# Patient Record
Sex: Male | Born: 1985 | Race: Black or African American | Hispanic: No | Marital: Married | Smoking: Never smoker
Health system: Southern US, Community
[De-identification: ages and names within clinical notes are randomized; demographics above are authoritative.]

## PROBLEM LIST (undated history)

## (undated) DIAGNOSIS — E669 Obesity, unspecified: Secondary | ICD-10-CM

## (undated) DIAGNOSIS — J302 Other seasonal allergic rhinitis: Secondary | ICD-10-CM

## (undated) DIAGNOSIS — K219 Gastro-esophageal reflux disease without esophagitis: Secondary | ICD-10-CM

## (undated) DIAGNOSIS — Z8619 Personal history of other infectious and parasitic diseases: Secondary | ICD-10-CM

## (undated) DIAGNOSIS — I10 Essential (primary) hypertension: Secondary | ICD-10-CM

## (undated) DIAGNOSIS — B20 Human immunodeficiency virus [HIV] disease: Secondary | ICD-10-CM

## (undated) DIAGNOSIS — G43909 Migraine, unspecified, not intractable, without status migrainosus: Secondary | ICD-10-CM

## (undated) DIAGNOSIS — Z21 Asymptomatic human immunodeficiency virus [HIV] infection status: Secondary | ICD-10-CM

## (undated) HISTORY — DX: Migraine, unspecified, not intractable, without status migrainosus: G43.909

## (undated) HISTORY — DX: Personal history of other infectious and parasitic diseases: Z86.19

## (undated) HISTORY — DX: Obesity, unspecified: E66.9

## (undated) HISTORY — DX: Other seasonal allergic rhinitis: J30.2

## (undated) HISTORY — DX: Gastro-esophageal reflux disease without esophagitis: K21.9

---

## 2007-04-21 HISTORY — PX: WISDOM TOOTH EXTRACTION: SHX21

## 2010-12-30 ENCOUNTER — Other Ambulatory Visit: Payer: Self-pay | Admitting: Internal Medicine

## 2010-12-30 DIAGNOSIS — Z0000 Encounter for general adult medical examination without abnormal findings: Secondary | ICD-10-CM

## 2010-12-30 LAB — URINALYSIS
Leukocytes, UA: NEGATIVE
Nitrite: NEGATIVE
Specific Gravity, Urine: 1.03 (ref 1.005–1.030)
pH: 6 (ref 5.0–8.0)

## 2010-12-30 LAB — BASIC METABOLIC PANEL
BUN: 13 mg/dL (ref 6–23)
CO2: 28 mEq/L (ref 19–32)
Calcium: 9.6 mg/dL (ref 8.4–10.5)
Glucose, Bld: 84 mg/dL (ref 70–99)
Sodium: 140 mEq/L (ref 135–145)

## 2010-12-30 LAB — LIPID PANEL
Cholesterol: 148 mg/dL (ref 0–200)
HDL: 33 mg/dL — ABNORMAL LOW (ref >39)

## 2010-12-30 LAB — HEPATIC FUNCTION PANEL
AST: 19 U/L (ref 0–37)
Albumin: 4.4 g/dL (ref 3.5–5.2)
Alkaline Phosphatase: 74 U/L (ref 39–117)
Total Protein: 7 g/dL (ref 6.0–8.3)

## 2010-12-30 LAB — TSH: TSH: 1.181 u[IU]/mL (ref 0.350–4.500)

## 2010-12-31 LAB — CBC
HCT: 42.4 % (ref 39.0–52.0)
Hemoglobin: 14.4 g/dL (ref 13.0–17.0)
MCH: 25.2 pg — ABNORMAL LOW (ref 26.0–34.0)
RBC: 5.72 MIL/uL (ref 4.22–5.81)

## 2011-01-01 ENCOUNTER — Ambulatory Visit (INDEPENDENT_AMBULATORY_CARE_PROVIDER_SITE_OTHER): Payer: 59 | Admitting: Internal Medicine

## 2011-01-01 ENCOUNTER — Encounter: Payer: Self-pay | Admitting: Internal Medicine

## 2011-01-01 DIAGNOSIS — N62 Hypertrophy of breast: Secondary | ICD-10-CM

## 2011-01-01 DIAGNOSIS — Z0000 Encounter for general adult medical examination without abnormal findings: Secondary | ICD-10-CM

## 2011-01-01 DIAGNOSIS — E786 Lipoprotein deficiency: Secondary | ICD-10-CM

## 2011-01-02 ENCOUNTER — Encounter: Payer: Self-pay | Admitting: Internal Medicine

## 2011-01-02 ENCOUNTER — Other Ambulatory Visit: Payer: Self-pay | Admitting: Internal Medicine

## 2011-01-02 DIAGNOSIS — E785 Hyperlipidemia, unspecified: Secondary | ICD-10-CM

## 2011-01-02 DIAGNOSIS — N62 Hypertrophy of breast: Secondary | ICD-10-CM | POA: Insufficient documentation

## 2011-01-02 DIAGNOSIS — E786 Lipoprotein deficiency: Secondary | ICD-10-CM | POA: Insufficient documentation

## 2011-01-02 DIAGNOSIS — Z0000 Encounter for general adult medical examination without abnormal findings: Secondary | ICD-10-CM | POA: Insufficient documentation

## 2011-01-02 NOTE — Assessment & Plan Note (Signed)
Recommend regular cardio exercise at least 4 days a week for 30 mins + duration. Repeat lipid prior to next visit

## 2011-01-02 NOTE — Assessment & Plan Note (Addendum)
Obtain prolactin, estradiol, and lh with next lab draw.

## 2011-01-02 NOTE — Progress Notes (Signed)
  Subjective:    Patient ID: Thomas Rivera, male    DOB: 09/21/1985, 25 y.o.   MRN: [id number]  HPI Pt presents to clinic to establish care and for physical exam. Denies significant past hx and feels well. Does note chronic bilateral breast enlargement which is stable. Attempting exercise. No other complaints.  Reviewed pmh, psh, medications, allergies, soc hx and fam hx.   Review of Systems  Respiratory: Negative for shortness of breath.   Cardiovascular: Negative for chest pain.  Gastrointestinal: Negative for abdominal pain and blood in stool.  All other systems reviewed and are negative.       Objective:   Physical Exam  Physical Exam  Nursing note and vitals reviewed. Constitutional: He appears well-developed and well-nourished. No distress.  HENT:  Head: Normocephalic and atraumatic.  Right Ear: Tympanic membrane and external ear normal.  Left Ear: Tympanic membrane and external ear normal.  Nose: Nose normal.  Mouth/Throat: Uvula is midline, oropharynx is clear and moist and mucous membranes are normal. No oropharyngeal exudate.  Eyes: Conjunctivae and EOM are normal. Pupils are equal, round, and reactive to light. Right eye exhibits no discharge. Left eye exhibits no discharge. No scleral icterus.  Neck: Neck supple. Carotid bruit is not present. No thyromegaly present.  Cardiovascular: Normal rate, regular rhythm and normal heart sounds.  Exam reveals no gallop and no friction rub.   No murmur heard. Pulmonary/Chest: Effort normal and breath sounds normal. No respiratory distress. He has no wheezes. He has no rales.  Abdominal: Soft. He exhibits no distension and no mass. There is no hepatosplenomegaly. There is no tenderness. There is no rebound. Hernia confirmed negative in the right inguinal area and confirmed negative in the left inguinal area.  Genitourinary: Rectum normal, prostate normal and testes normal. Rectal exam shows no mass and no tenderness. Guaiac  negative stool. Prostate is not enlarged and not tender. Right testis shows no mass, no swelling and no tenderness. Right testis is descended. Left testis shows no mass, no swelling and no tenderness. Left testis is descended.  Lymphadenopathy:    He has no cervical adenopathy.       Right: No inguinal adenopathy present.       Left: No inguinal adenopathy present.  Neurological: He is alert.  Skin: Skin is warm and dry. He is not diaphoretic.  Psychiatric: He has a normal mood and affect.   Breast: bilateral gynecomastia      Assessment & Plan:

## 2011-01-02 NOTE — Assessment & Plan Note (Addendum)
Reviewed cpe labs nl except low hdl.

## 2011-05-07 ENCOUNTER — Ambulatory Visit: Payer: 59 | Admitting: Internal Medicine

## 2011-05-20 ENCOUNTER — Ambulatory Visit (INDEPENDENT_AMBULATORY_CARE_PROVIDER_SITE_OTHER): Payer: 59 | Admitting: Family

## 2011-05-20 ENCOUNTER — Encounter: Payer: Self-pay | Admitting: Family

## 2011-05-20 DIAGNOSIS — A088 Other specified intestinal infections: Secondary | ICD-10-CM

## 2011-05-20 DIAGNOSIS — A084 Viral intestinal infection, unspecified: Secondary | ICD-10-CM

## 2011-05-20 MED ORDER — ONDANSETRON HCL 4 MG PO TABS: 4.0000 mg | ORAL_TABLET | Freq: Three times a day (TID) | ORAL | Status: AC | PRN

## 2011-05-20 NOTE — Patient Instructions (Signed)
You may use OTC imodium as needed for diarrhea. Viral Gastroenteritis Viral gastroenteritis is also known as stomach flu. This condition affects the stomach and intestinal tract. The illness typically lasts 3 to 8 days. Most people develop an immune response. This eventually gets rid of the virus. While this natural response develops, the virus can make you quite ill.  CAUSES  Diarrhea and vomiting are often caused by a virus. Medicines (antibiotics) that kill germs will not help unless there is also a germ (bacterial) infection. SYMPTOMS  The most common symptom is diarrhea. This can cause severe loss of fluids (dehydration) and body salt (electrolyte) imbalance. TREATMENT  Treatments for this illness are aimed at rehydration. Antidiarrheal medicines are not recommended. They do not decrease diarrhea volume and may be harmful. Usually, home treatment is all that is needed. The most serious cases involve vomiting so severely that you are not able to keep down fluids taken by mouth (orally). In these cases, intravenous (IV) fluids are needed. Vomiting with viral gastroenteritis is common, but it will usually go away with treatment. HOME CARE INSTRUCTIONS   Small amounts of fluids should be taken frequently. Large amounts at one time may not be tolerated. Plain water may be harmful in infants and the elderly. Oral rehydration solutions (ORS) are available at pharmacies and grocery stores. ORS replace water and important electrolytes in proper proportions. Sports drinks are not as effective as ORS and may be harmful due to sugars worsening diarrhea.  Adults should eat normally while drinking more fluids than usual. Drink small amounts of fluids frequently and increase as tolerated. Drink enough water and fluids to keep your urine clear or pale yellow. Broths, weak decaffeinated tea, lemon-lime soft drinks (allowed to go flat), and ORS replace fluids and electrolytes.   Avoid:   Carbonated drinks.    Juice.   Extremely hot or cold fluids.   Caffeine drinks.   Fatty, greasy foods.   Alcohol.   Tobacco.   Too much intake of anything at one time.   Gelatin desserts.   Probiotics are active cultures of beneficial bacteria. They may lessen the amount and number of diarrheal stools in adults. Probiotics can be found in yogurt with active cultures and in supplements.   Wash your hands well to avoid spreading bacteria and viruses.   Antidiarrheal medicines are not recommended for infants and children.   Only take over-the-counter or prescription medicines for pain, discomfort, or fever as directed by your caregiver. Do not give aspirin to children.   For adults with dehydration, ask your caregiver if you should continue all prescribed and over-the-counter medicines.   If your caregiver has given you a follow-up appointment, it is very important to keep that appointment. Not keeping the appointment could result in a lasting (chronic) or permanent injury and disability. If there is any problem keeping the appointment, you must call to reschedule.  SEEK IMMEDIATE MEDICAL CARE IF:   You are unable to keep fluids down.   There is no urine output in 6 to 8 hours or there is only a small amount of very dark urine.   You develop shortness of breath.   There is blood in the vomit (may look like coffee grounds) or stool.   Belly (abdominal) pain develops, increases, or localizes.   There is persistent vomiting or diarrhea.   You have a fever.   Your baby is older than 3 months with a rectal temperature of 102 F (38.9 C) or higher.  Your baby is 47 months old or younger with a rectal temperature of 100.4 F (38 C) or higher.  MAKE SURE YOU:   Understand these instructions.   Will watch your condition.   Will get help right away if you are not doing well or get worse.  Document Released: 04/06/2005 Document Revised: 12/17/2010 Document Reviewed: 08/18/2006 [facility]  Patient Information 2012 [place], [place].

## 2011-05-20 NOTE — Assessment & Plan Note (Signed)
26 yr old male with symptoms consistent with acute viral gastroenteritis.  Recommended hydration, supportive measures such as prn imodium or zofran. He is to contact us if symptoms worsen or if he is not feeling better in 2-3 days.

## 2011-05-20 NOTE — Progress Notes (Signed)
Subjective:    Patient ID: Thomas Rivera, male    DOB: 11-22-1985, 26 y.o.   MRN: [id number]  HPI  Mr.  Thomas Rivera is a 26 yr old male who presents today with chief complaint of diarrhea.  He reports that symptoms started yesterday. He reports 10 loose stools yesterday and 4 loose stools today which he describes as pudding consistency. He has had associated nausea- vomited once yesterday.  Denies associated black, bloody stools or  fever.  He has not taken anything over the counter.  He has slept most of the day so far today.  He has not had any sick contacts.     Review of Systems See HPI  Past Medical History  Diagnosis Date  . History of chicken pox     childhood  . GERD (gastroesophageal reflux disease)   . Seasonal allergies   . Migraines      1-2 per month    History   Social History  . Marital Status: Married    Spouse Name: N/A    Number of Children: N/A  . Years of Education: N/A   Occupational History  . Not on file.   Social History Main Topics  . Smoking status: Never Smoker   . Smokeless tobacco: Never Used  . Alcohol Use: Yes     socially  . Drug Use: No  . Sexually Active: Not on file   Other Topics Concern  . Not on file   Social History Narrative  . No narrative on file    Past Surgical History  Procedure Date  . Wisdom tooth extraction 2009    Family History  Problem Relation Age of Onset  . Arthritis      maternal great grandmother  . Hypertension Maternal Grandmother   . Diabetes      maternal great grandmother  . Healthy Brother     half     Allergies  Allergen Reactions  . Amoxicillin     Severe swelling  . Bee     Severe swelling of sting site    Current Outpatient Prescriptions on File Prior to Visit  Medication Sig Dispense Refill  . eletriptan (RELPAX) 40 MG tablet One tablet by mouth as needed for migraine headache.  If the headache improves and then returns, dose may be repeated after 2 hours have elapsed since first  dose (do not exceed 80 mg per day). may repeat in 2 hours if necessary       . ibuprofen (ADVIL,MOTRIN) 800 MG tablet Take 800 mg by mouth every 8 (eight) hours as needed.          BP 126/82  Pulse 74  Temp(Src) 98.1 F (36.7 C) (Oral)  Resp 18  Ht 5' 8.5" (1.74 m)  Wt 283 lb (128.368 kg)  BMI 42.40 kg/m2  SpO2 98%       Objective:   Physical Exam  Constitutional: He appears well-developed and well-nourished. No distress.  Eyes: No scleral icterus.  Cardiovascular: Normal rate and regular rhythm.   No murmur heard. Pulmonary/Chest: Effort normal and breath sounds normal. No respiratory distress. He has no wheezes. He has no rales. He exhibits no tenderness.  Abdominal: Soft. Bowel sounds are normal. He exhibits no distension and no mass. There is no tenderness. There is no rebound and no guarding.  Skin: Skin is warm and dry.  Psychiatric: He has a normal mood and affect. His behavior is normal. Judgment and thought content normal.  Assessment & Plan:

## 2011-06-03 ENCOUNTER — Telehealth: Payer: Self-pay | Admitting: Internal Medicine

## 2011-06-03 MED ORDER — SCOPOLAMINE 1 MG/3DAYS TD PT72: MEDICATED_PATCH | TRANSDERMAL | Status: DC

## 2011-06-03 NOTE — Telephone Encounter (Signed)
Patient states that he will be flying and he is requesting that Dr. Rodena Medin send nausea patches to [facility] on [address].

## 2011-06-03 NOTE — Telephone Encounter (Signed)
Believe is talking about scopolamine patch. Apply behind ear 4 hours before event. May change after 3 days. One box

## 2011-06-03 NOTE — Telephone Encounter (Signed)
Call placed to patient at [phone], he was informed Rx sent to pharmacy per Dr Rodena Medin okay. Rx resubmitted to correct pharmacy.

## 2011-07-20 ENCOUNTER — Encounter: Payer: Self-pay | Admitting: Internal Medicine

## 2011-07-20 ENCOUNTER — Ambulatory Visit (INDEPENDENT_AMBULATORY_CARE_PROVIDER_SITE_OTHER): Payer: 59 | Admitting: Internal Medicine

## 2011-07-20 VITALS — BP 118/84 | HR 84 | Temp 98.0°F | Resp 18

## 2011-07-20 DIAGNOSIS — J069 Acute upper respiratory infection, unspecified: Secondary | ICD-10-CM

## 2011-07-20 MED ORDER — DOXYCYCLINE HYCLATE 100 MG PO TABS: 100.0000 mg | ORAL_TABLET | Freq: Two times a day (BID) | ORAL | Status: AC

## 2011-07-29 DIAGNOSIS — J069 Acute upper respiratory infection, unspecified: Secondary | ICD-10-CM | POA: Insufficient documentation

## 2011-07-29 NOTE — Assessment & Plan Note (Signed)
Continue otc sx tx prn. Given abx to hold. Begin abx if sx's do not improve after total duration of 8-10 days. Followup if no improvement or worsening.

## 2011-07-29 NOTE — Progress Notes (Signed)
  Subjective:    Patient ID: Thomas Rivera, male    DOB: 1986-03-20, 26 y.o.   MRN: [id number]  HPI Pt presents to clinic for evaluation of cough. Notes 2d h/o sinus pressure, nasal congestion/drainage and cough productive for green sputum. No f/c. Taking otc medication without improvement. No alleviating or exacerbating factors. No other complaints.  Past Medical History  Diagnosis Date  . History of chicken pox     childhood  . GERD (gastroesophageal reflux disease)   . Seasonal allergies   . Migraines      1-2 per month   Past Surgical History  Procedure Date  . Wisdom tooth extraction 2009    reports that he has never smoked. He has never used smokeless tobacco. He reports that he drinks alcohol. He reports that he does not use illicit drugs. family history includes Arthritis in an unspecified family member; Diabetes in an unspecified family member; Healthy in his brother; and Hypertension in his maternal grandmother. Allergies  Allergen Reactions  . Amoxicillin     Severe swelling  . Bee     Severe swelling of sting site     Review of Systems see hpi     Objective:   Physical Exam  Nursing note and vitals reviewed. Constitutional: He appears well-developed and well-nourished. No distress.  HENT:  Head: Normocephalic and atraumatic.  Right Ear: External ear normal.  Left Ear: External ear normal.  Nose: Nose normal.  Mouth/Throat: Oropharynx is clear and moist. No oropharyngeal exudate.  Eyes: Conjunctivae are normal. Right eye exhibits no discharge. Left eye exhibits no discharge. No scleral icterus.  Neck: Neck supple.  Pulmonary/Chest: Effort normal and breath sounds normal. No respiratory distress. He has no wheezes. He has no rales.  Lymphadenopathy:    He has no cervical adenopathy.  Neurological: He is alert.  Skin: Skin is warm and dry. He is not diaphoretic.  Psychiatric: He has a normal mood and affect.          Assessment & Plan:

## 2011-08-04 ENCOUNTER — Encounter: Payer: Self-pay | Admitting: Internal Medicine

## 2011-08-04 ENCOUNTER — Ambulatory Visit (INDEPENDENT_AMBULATORY_CARE_PROVIDER_SITE_OTHER): Payer: 59 | Admitting: Internal Medicine

## 2011-08-04 VITALS — BP 110/80 | HR 95 | Temp 98.4°F | Resp 16 | Wt 302.1 lb

## 2011-08-04 DIAGNOSIS — R5381 Other malaise: Secondary | ICD-10-CM

## 2011-08-04 DIAGNOSIS — R635 Abnormal weight gain: Secondary | ICD-10-CM

## 2011-08-04 DIAGNOSIS — R0600 Dyspnea, unspecified: Secondary | ICD-10-CM

## 2011-08-04 DIAGNOSIS — R0989 Other specified symptoms and signs involving the circulatory and respiratory systems: Secondary | ICD-10-CM

## 2011-08-04 DIAGNOSIS — R609 Edema, unspecified: Secondary | ICD-10-CM

## 2011-08-04 DIAGNOSIS — R110 Nausea: Secondary | ICD-10-CM

## 2011-08-04 DIAGNOSIS — R5383 Other fatigue: Secondary | ICD-10-CM

## 2011-08-04 MED ORDER — ONDANSETRON HCL 8 MG PO TABS: 8.0000 mg | ORAL_TABLET | Freq: Three times a day (TID) | ORAL | Status: AC | PRN

## 2011-08-04 MED ORDER — FUROSEMIDE 20 MG PO TABS: ORAL_TABLET | ORAL | Status: DC

## 2011-08-04 NOTE — Progress Notes (Signed)
  Subjective:    Patient ID: Thomas Rivera, male    DOB: 1986-03-28, 26 y.o.   MRN: [id number]  HPI Pt presents to clinic for evaluation of swelling. Notes one week h/o bilateral le swelling as well as ~6d h/o facial swelling. Notes slight dyspnea as generalized fatigue and malaise. Has unprovoked wt gain possibly ~10lbs over the past week. Notes mild nausea without emesis or abdominal pain. Recent URI resolved. No other alleviating or exacerbating factors.  Past Medical History  Diagnosis Date  . History of chicken pox     childhood  . GERD (gastroesophageal reflux disease)   . Seasonal allergies   . Migraines      1-2 per month   Past Surgical History  Procedure Date  . Wisdom tooth extraction 2009    reports that he has never smoked. He has never used smokeless tobacco. He reports that he drinks alcohol. He reports that he does not use illicit drugs. family history includes Arthritis in an unspecified family member; Diabetes in an unspecified family member; Healthy in his brother; and Hypertension in his maternal grandmother. Allergies  Allergen Reactions  . Amoxicillin     Severe swelling  . Bee     Severe swelling of sting site      Review of Systems see hpi     Objective:   Physical Exam  Nursing note and vitals reviewed. Constitutional: He appears well-developed and well-nourished. No distress.  HENT:  Head: Normocephalic and atraumatic.  Right Ear: External ear normal.  Left Ear: External ear normal.  Eyes: Conjunctivae are normal. No scleral icterus.  Neck: Neck supple. No JVD present.  Cardiovascular: Normal rate, regular rhythm and normal heart sounds.  Exam reveals no gallop and no friction rub.   No murmur heard. Pulmonary/Chest: Effort normal and breath sounds normal. No respiratory distress. He has no wheezes. He has no rales.  Neurological: He is alert.  Skin: Skin is warm and dry. He is not diaphoretic.       Bilateral le ext: trace to +1 edema    Psychiatric: He has a normal mood and affect.          Assessment & Plan:

## 2011-08-05 ENCOUNTER — Telehealth: Payer: Self-pay | Admitting: Internal Medicine

## 2011-08-05 ENCOUNTER — Ambulatory Visit (INDEPENDENT_AMBULATORY_CARE_PROVIDER_SITE_OTHER)
Admission: RE | Admit: 2011-08-05 | Discharge: 2011-08-05 | Disposition: A | Discharge status: Home / Self Care | Payer: 59 | Source: Ambulatory Visit | Attending: Internal Medicine | Admitting: Internal Medicine

## 2011-08-05 ENCOUNTER — Ambulatory Visit: Payer: 59 | Admitting: Internal Medicine

## 2011-08-05 ENCOUNTER — Other Ambulatory Visit (INDEPENDENT_AMBULATORY_CARE_PROVIDER_SITE_OTHER): Payer: 59 | Admitting: Internal Medicine

## 2011-08-05 ENCOUNTER — Ambulatory Visit (HOSPITAL_BASED_OUTPATIENT_CLINIC_OR_DEPARTMENT_OTHER)
Admission: RE | Admit: 2011-08-05 | Discharge: 2011-08-05 | Disposition: A | Discharge status: Home / Self Care | Payer: 59 | Source: Ambulatory Visit | Attending: Internal Medicine | Admitting: Internal Medicine

## 2011-08-05 DIAGNOSIS — R609 Edema, unspecified: Secondary | ICD-10-CM | POA: Insufficient documentation

## 2011-08-05 DIAGNOSIS — R7989 Other specified abnormal findings of blood chemistry: Secondary | ICD-10-CM

## 2011-08-05 DIAGNOSIS — R1900 Intra-abdominal and pelvic swelling, mass and lump, unspecified site: Secondary | ICD-10-CM

## 2011-08-05 DIAGNOSIS — R945 Abnormal results of liver function studies: Secondary | ICD-10-CM | POA: Insufficient documentation

## 2011-08-05 DIAGNOSIS — R109 Unspecified abdominal pain: Secondary | ICD-10-CM

## 2011-08-05 DIAGNOSIS — J029 Acute pharyngitis, unspecified: Secondary | ICD-10-CM

## 2011-08-05 DIAGNOSIS — R748 Abnormal levels of other serum enzymes: Secondary | ICD-10-CM

## 2011-08-05 DIAGNOSIS — N289 Disorder of kidney and ureter, unspecified: Secondary | ICD-10-CM

## 2011-08-05 LAB — CBC WITH DIFFERENTIAL/PLATELET
Basophils Absolute: 0 10*3/uL (ref 0.0–0.1)
Basophils Relative: 0 % (ref 0–1)
Eosinophils Relative: 2 % (ref 0–5)
HCT: 44.6 % (ref 39.0–52.0)
MCHC: 33.4 g/dL (ref 30.0–36.0)
MCV: 74 fL — ABNORMAL LOW (ref 78.0–100.0)
Monocytes Absolute: 0.7 10*3/uL (ref 0.1–1.0)
RDW: 13.2 % (ref 11.5–15.5)

## 2011-08-05 LAB — HEPATIC FUNCTION PANEL
ALT: 82 U/L — ABNORMAL HIGH (ref 0–53)
ALT: 96 U/L — ABNORMAL HIGH (ref 0–53)
Albumin: <2 g/dL — ABNORMAL LOW (ref 3.5–5.2)
Bilirubin, Direct: 0.1 mg/dL (ref 0.0–0.3)
Indirect Bilirubin: 0.4 mg/dL (ref 0.0–0.9)
Total Bilirubin: 0.4 mg/dL (ref 0.3–1.2)
Total Protein: 4.7 g/dL — ABNORMAL LOW (ref 6.0–8.3)

## 2011-08-05 LAB — BASIC METABOLIC PANEL
BUN: 31 mg/dL — ABNORMAL HIGH (ref 6–23)
Creat: 1.47 mg/dL — ABNORMAL HIGH (ref 0.50–1.35)

## 2011-08-05 LAB — URINALYSIS, MICROSCOPIC ONLY: Bacteria, UA: NONE SEEN

## 2011-08-05 LAB — FERRITIN: Ferritin: 324 ng/mL — ABNORMAL HIGH (ref 22–322)

## 2011-08-05 LAB — POCT RAPID STREP A (OFFICE): Rapid Strep A Screen: NEGATIVE

## 2011-08-05 LAB — URINALYSIS, ROUTINE W REFLEX MICROSCOPIC
Leukocytes, UA: NEGATIVE
Nitrite: NEGATIVE
Protein, ur: >300 mg/dL — AB
Urobilinogen, UA: 0.2 mg/dL (ref 0.0–1.0)

## 2011-08-05 LAB — PROTIME-INR
INR: 0.98 (ref <1.50)
Prothrombin Time: 13.4 seconds (ref 11.6–15.2)

## 2011-08-05 LAB — T4, FREE: Free T4: 0.9 ng/dL (ref 0.80–1.80)

## 2011-08-05 LAB — ANTISTREPTOLYSIN O TITER: ASO: <25 IU/mL (ref 0–408)

## 2011-08-05 LAB — TSH: TSH: 3.25 u[IU]/mL (ref 0.350–4.500)

## 2011-08-05 NOTE — Telephone Encounter (Signed)
Call placed to patient at [phone], he was informed ultrasound could be performed at 2p. He was advised to return to office for stat blood work and then to radiology for ultrasound. Patient has verbalized understanding and will proceed to lab for requested blood work and radiology for ultrasound.

## 2011-08-05 NOTE — Telephone Encounter (Signed)
Patient states that he is not feeling any better since yesterday. He would like a call back.

## 2011-08-05 NOTE — Telephone Encounter (Signed)
Stat lab, urine and [place] orders placed. Can you release labs and see if radiology can workin for [place] while here for labs? Is npo. Referral order placed.

## 2011-08-05 NOTE — Telephone Encounter (Signed)
Received call from [facility] at [place] lab with alert value on pt's albumin level of 2.0. Result was repeated and verified.

## 2011-08-05 NOTE — Telephone Encounter (Signed)
Discussed lab results with pt and pt's wife. Proceeding with further evaluation and considering nephrology evaluation.

## 2011-08-05 NOTE — Telephone Encounter (Signed)
Call placed to patient [phone], c/o severe headache, and nausea, swelling is a little worse than yesterday, he states he has left hand tingling and numbness, he denies vision changes, he denies stiffness in neck, no difficult speaking no fever. He stated that his urinary out put has not change, but the swelling in his face seems to be worse.

## 2011-08-05 NOTE — Telephone Encounter (Signed)
Patient is requesting lab results.

## 2011-08-05 NOTE — Assessment & Plan Note (Signed)
With associated wt gain, fatigue. Obtain cbc, chem7, lft, ua, tsh/free t4 and bnp. Schedule close follow up in one week or sooner if needed. Attempt low dose lasix prn swelling. zofran prn n/v.

## 2011-08-06 LAB — PROTEIN, URINE, 24 HOUR
Protein, 24H Urine: 28505 mg/d — ABNORMAL HIGH (ref 50–100)
Protein, Urine: 1839 mg/dL

## 2011-08-06 LAB — HEPATITIS PANEL, ACUTE: Hep A IgM: NEGATIVE

## 2011-08-07 LAB — ANTI-NUCLEAR AB-TITER (ANA TITER): ANA Titer 1: 1:80 {titer} — ABNORMAL HIGH

## 2011-08-07 LAB — ANA: Anti Nuclear Antibody(ANA): POSITIVE — AB

## 2011-08-10 ENCOUNTER — Other Ambulatory Visit: Payer: Self-pay | Admitting: Radiology

## 2011-08-10 ENCOUNTER — Other Ambulatory Visit (HOSPITAL_COMMUNITY): Payer: Self-pay | Admitting: Nephrology

## 2011-08-10 DIAGNOSIS — R809 Proteinuria, unspecified: Secondary | ICD-10-CM

## 2011-08-11 ENCOUNTER — Ambulatory Visit: Payer: 59 | Admitting: Internal Medicine

## 2011-08-11 ENCOUNTER — Encounter (HOSPITAL_COMMUNITY): Payer: Self-pay

## 2011-08-11 ENCOUNTER — Ambulatory Visit (HOSPITAL_COMMUNITY)
Admission: RE | Admit: 2011-08-11 | Discharge: 2011-08-11 | Disposition: A | Discharge status: Home / Self Care | Payer: 59 | Source: Ambulatory Visit | Attending: Nephrology | Admitting: Nephrology

## 2011-08-11 DIAGNOSIS — R809 Proteinuria, unspecified: Secondary | ICD-10-CM

## 2011-08-11 LAB — CBC
MCH: 24.5 pg — ABNORMAL LOW (ref 26.0–34.0)
MCHC: 33.8 g/dL (ref 30.0–36.0)
MCV: 72.5 fL — ABNORMAL LOW (ref 78.0–100.0)
Platelets: 393 10*3/uL (ref 150–400)
RBC: 5.1 MIL/uL (ref 4.22–5.81)
RDW: 13.1 % (ref 11.5–15.5)

## 2011-08-11 LAB — PROTIME-INR: Prothrombin Time: 13.6 seconds (ref 11.6–15.2)

## 2011-08-11 MED ORDER — SODIUM CHLORIDE 0.9 % IV SOLN
INTRAVENOUS | Status: AC | PRN
  Administered 2011-08-11: 75 mL/h via INTRAVENOUS

## 2011-08-11 MED ORDER — MIDAZOLAM HCL 5 MG/5ML IJ SOLN
INTRAMUSCULAR | Status: AC | PRN
  Administered 2011-08-11: 0.5 mg via INTRAVENOUS
  Administered 2011-08-11: 1.5 mg via INTRAVENOUS

## 2011-08-11 MED ORDER — FENTANYL CITRATE 0.05 MG/ML IJ SOLN
INTRAMUSCULAR | Status: AC
  Filled 2011-08-11: qty 4

## 2011-08-11 MED ORDER — SODIUM CHLORIDE 0.9 % IV SOLN: Freq: Once | INTRAVENOUS | Status: DC

## 2011-08-11 MED ORDER — MIDAZOLAM HCL 2 MG/2ML IJ SOLN
INTRAMUSCULAR | Status: AC
  Filled 2011-08-11: qty 4

## 2011-08-11 MED ORDER — HYDROCODONE-ACETAMINOPHEN 5-325 MG PO TABS
ORAL_TABLET | ORAL | Status: AC
  Filled 2011-08-11: qty 1

## 2011-08-11 MED ORDER — FENTANYL CITRATE 0.05 MG/ML IJ SOLN
INTRAMUSCULAR | Status: AC | PRN
  Administered 2011-08-11: 25 ug via INTRAVENOUS
  Administered 2011-08-11 (×2): 50 ug via INTRAVENOUS

## 2011-08-11 MED ORDER — HYDROCODONE-ACETAMINOPHEN 5-325 MG PO TABS
1.0000 | ORAL_TABLET | Freq: Four times a day (QID) | ORAL | Status: DC | PRN
  Administered 2011-08-11: 1 via ORAL

## 2011-08-11 NOTE — Discharge Instructions (Signed)
Kidney Biopsy  A biopsy is a test that involves collecting small pieces of tissue, usually through a needle. The tissue is then examined under a microscope. A kidney biopsy can help find a diagnosis and determine the best course of treatment. Your caregiver may recommend a kidney biopsy if you have any of the following conditions:   Hematuria. This is blood in your urine.   Proteinuria. This is when there is excessive protein in your urine.   Impaired kidney function that causes excessive waste products in your blood.  A specialist will look at the kidney tissue samples to check for unusual deposits, scarring, or infecting organisms that would explain your condition. Your caregiver may discover that you have a condition that can be treated and cured. If you have progressive kidney failure, the biopsy may show how quickly the disease is advancing. A biopsy can also help explain why a transplanted kidney is not working properly. Talk with your caregiver about what information might be learned from the biopsy and the risks involved. This can help you make a decision about whether a biopsy is worthwhile in your case.  BEFORE THE PROCEDURE   Make sure you understand the need for a biopsy.   Tell your caregiver about any allergies you have and medicines you take.   Avoid food and fluid for 8 hours before the test.   You will have to sign a consent form indicating that you understand the risks involved in this procedure. They are very rare. Discuss these risks thoroughly with your caregiver before you sign the form.   Make sure your caregiver is aware of all the medicines you take and any drug allergies you might have. Shortly before the biopsy, you will give blood and urine samples. This is to make sure you do not have a condition that would suggest not doing a biopsy.  PROCEDURE   Kidney biopsies are usually done in a hospital. You may be fully awake with light sedation or you may be asleep under general  anesthesia. If you are awake, you will be given a local anesthetic before the needle is inserted.   You will lie on your stomach to position the kidneys near the surface of your back. If you have a transplanted kidney, you will lie on your back. The doctor will inject a local painkiller. For a through the skin (percutaneous) biopsy, the doctor will use a locating needle and X-ray or ultrasound equipment to find the right spot and then a collecting needle to gather the tissue. You will be asked to hold your breath as the doctor inserts the biopsy needle and collects the tissue. This is usually for about 30 seconds or a little longer for each insertion. Do not exhale until you are told.   The entire procedure usually takes an hour. This includes time to locate the kidney, clean the biopsy site, inject the local painkiller, and obtain the tissue samples.   Some patients should not have a percutaneous biopsy if they are prone to bleeding problems. These patients may still undergo a kidney biopsy through an open operation. This is when the surgeon makes an incision and can see the kidney to obtain a biopsy.  AFTER THE PROCEDURE   You will lie on your back for 12 to 24 hours. During this time, your back will probably feel sore. If you have a transplanted kidney, you will lie on your stomach. You may stay in the hospital overnight after the procedure so   that staff can check your condition. You may notice some blood in your urine for 24 hours after the test. To detect any problems, your caregivers will:   Monitor your blood pressure and pulse.   Take blood samples to measure the amount of red cells.   Examine the urine that you pass.   On rare occasions when bleeding does not stop on its own, it may be necessary to replace lost blood with a transfusion.   A rare complication is infection from the biopsy procedure.  SEEK MEDICAL CARE IF:   You have bloody urine more than 24 hours after the test.   You have a  fever.   You feel faint or dizzy .   You cannot urinate .   You have worsening pain in the biopsy.  OBTAINING THE TEST RESULTS  It is your responsibility to obtain your test results. Ask the lab or department performing the test when and how you will get your results.  FOR MORE INFORMATION   [employer]: www.akfinc.org   National Kidney Foundation: www.kidney.org   National Kidney and urologic Diseases Information Clearinghouse: http://kidney.niddk.nih.gov/  Document Released: 02/15/2004 Document Revised: 03/26/2011 Document Reviewed: 03/24/2010  [employer] Patient Information 2012 [employer].

## 2011-08-11 NOTE — H&P (Signed)
Thomas Rivera is an 26 y.o. male.   Chief Complaint: Proteinuria, Acute glomerulonephritis HPI: Pt with recent LE edema found to have profound proteinuria, +ANA Workup for Lupus in progress, but request for IR to perform random renal biopsy to aid in diagnosis. Pt otherwise very healthy aside from obesity.  Past Medical History  Diagnosis Date  . History of chicken pox     childhood  . GERD (gastroesophageal reflux disease)   . Seasonal allergies   . Migraines      1-2 per month    Past Surgical History  Procedure Date  . Wisdom tooth extraction 2009    Family History  Problem Relation Age of Onset  . Arthritis      maternal great grandmother  . Hypertension Maternal Grandmother   . Diabetes      maternal great grandmother  . Healthy Brother     half    Social History:  reports that he has never smoked. He has never used smokeless tobacco. He reports that he drinks alcohol. He reports that he does not use illicit drugs.  Allergies:  Allergies  Allergen Reactions  . Amoxicillin     Severe swelling  . Bee     Severe swelling of sting site     (Not in a hospital admission)  Results for orders placed during the hospital encounter of 08/11/11 (from the past 48 hour(s))  APTT     Status: Normal   Collection Time   08/11/11  8:53 AM      Component Value Range Comment   aPTT 31  24 - 37 (seconds)   CBC     Status: Abnormal   Collection Time   08/11/11  8:53 AM      Component Value Range Comment   WBC 10.6 (*) 4.0 - 10.5 (K/uL)    RBC 5.10  4.22 - 5.81 (MIL/uL)    Hemoglobin 12.5 (*) 13.0 - 17.0 (g/dL)    HCT 45.4 (*) 09.8 - 52.0 (%)    MCV 72.5 (*) 78.0 - 100.0 (fL)    MCH 24.5 (*) 26.0 - 34.0 (pg)    MCHC 33.8  30.0 - 36.0 (g/dL)    RDW 11.9  14.7 - 82.9 (%)    Platelets 393  150 - 400 (K/uL)   PROTIME-INR     Status: Normal   Collection Time   08/11/11  8:53 AM      Component Value Range Comment   Prothrombin Time 13.6  11.6 - 15.2 (seconds)    INR 1.02   0.00 - 1.49     No results found.  Review of Systems  Constitutional: Negative for fever, chills, weight loss and malaise/fatigue.  Respiratory: Negative for cough, shortness of breath and wheezing.   Cardiovascular: Positive for leg swelling. Negative for chest pain, palpitations and orthopnea.  Gastrointestinal: Negative for nausea, vomiting, abdominal pain, diarrhea and constipation.  Genitourinary: Negative for dysuria, urgency, hematuria and flank pain.  Musculoskeletal: Negative for myalgias and back pain.  Neurological: Negative for headaches.    Blood pressure 143/84, pulse 58, temperature 96.9 F (36.1 C), temperature source Oral, resp. rate 18, height 5\' 10"  (1.778 m), weight 302 lb (136.986 kg), SpO2 98.00%. Physical Exam  Constitutional: He is oriented to person, place, and time. He appears well-developed and well-nourished. No distress.  HENT:  Head: Normocephalic and atraumatic.  Neck: Normal range of motion. Neck supple. No tracheal deviation present. No thyromegaly present.  Cardiovascular: Normal rate, regular rhythm and  normal heart sounds.   No murmur heard. Respiratory: Effort normal and breath sounds normal. No respiratory distress. He has no wheezes.  GI: Soft. Bowel sounds are normal. He exhibits no distension and no mass. There is no tenderness.  Musculoskeletal: Normal range of motion. He exhibits no edema.  Neurological: He is alert and oriented to person, place, and time. No cranial nerve deficit.  Skin: Skin is warm and dry. No rash noted.  Psychiatric: He has a normal mood and affect. His behavior is normal. Judgment and thought content normal.     Assessment/Plan Proteinuria Glomerulonephritis Labs pending, WBC slightly up, likely related to prednisone dosing. For US guided random renal biopsy today. Explained procedure including risks and complications to pt and wife. Consent signed in chart.  Allayne Butcher 08/11/2011, 9:32 AM

## 2011-08-11 NOTE — ED Notes (Signed)
To nurses station, awaiting SS bed.  Pain eased to 5/10.  VSS.  Taking pos.  Will give po med after crackers.

## 2011-08-11 NOTE — ED Notes (Signed)
O2 d/c'd 

## 2011-08-11 NOTE — ED Notes (Signed)
Site ok per Claris Che.  Pain is easing 5/10.

## 2011-08-11 NOTE — ED Notes (Signed)
Pt. C/O L flank pain, 9/10.  Dr Lowella Dandy called.  Ok to give fentanyl.  [place] tech to recheck site.

## 2011-08-11 NOTE — Procedures (Signed)
US guided left kidney biopsy.  2 16-gauge cores obtained.  No immediate complication.

## 2011-08-25 ENCOUNTER — Ambulatory Visit: Payer: 59 | Admitting: Internal Medicine

## 2011-11-13 ENCOUNTER — Encounter: Payer: Self-pay | Admitting: *Deleted

## 2011-11-13 ENCOUNTER — Encounter: Payer: 59 | Attending: Nephrology | Admitting: *Deleted

## 2011-11-13 DIAGNOSIS — E669 Obesity, unspecified: Secondary | ICD-10-CM | POA: Insufficient documentation

## 2011-11-13 DIAGNOSIS — Z713 Dietary counseling and surveillance: Secondary | ICD-10-CM | POA: Insufficient documentation

## 2011-11-13 NOTE — Progress Notes (Signed)
  Medical Nutrition Therapy:  Appt start time: 0800 end time:  0900.  Assessment:  Primary concerns today: patient here for assistance with obesity. He was diagnosed with Lupus in April, 2013 and started on Prednisone. He has gained 15 pounds since then. 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 He and his wife just had their first daughter 6 weeks ago. He is also in school for degree in Automotive engineer which he is quite excited about. He does have an hour for lunch and plans to start walking with a co-worker for 30 minutes of that time.    MEDICATIONS: see list   DIETARY INTAKE:  Usual eating pattern includes 3 meals and 3 snacks per day.  Everyday foods include good variety of all food groups.  Avoided foods include mayo on sandwiches.    24-hr recall:  B ( AM):3 eggs,2 bacon, occasionally 2 toast plain or 1 pancake, water to drink Snk ( AM): PNB crackers or fresh fruit (used to have a sub sandwich)  L ( PM): bring from home: sandwich plain OR [employer] sandwich with fries and soda OR left overs: lean meat (sauted) with vegetables, starch Snk (5:30 PM): yogurt or sweetened cereal with 2% milk D (7:30 PM): lean meat, starch, vegetable, regular soda, sweet tea, lemonade or water Snk ( PM): not now Beverages: water, regular soda, sweet tea, lemonade  Usual physical activity: none now, too tired with Lupus  Estimated energy needs: 2400 calories 270 g carbohydrates 180 g protein 67 g fat  Progress Towards Goal(s):  In progress.   Nutritional Diagnosis:  NI-1.5 Excessive energy intake As related to activity level.  As evidenced by BMI of 42.4.    Intervention:  Nutrition counseling provided. Discussed calorie content of macro-nutrients and taught carb counting and reading of food labels. Per diet history he is consuming excessive calories and carbohydrates from sweetened beverages so we discussed lower calorie options. Also discussed rationale and  benefit of a planned exercise activity that can be incorporatated into his current schedule such as his walking plan.  Plan: Aim for 4 Carb choices (60 grams) per meal +/- 1 either way Aim for 0-2 Carbs per snack if hungry Consider reading food labels for total carbohydrate of foods Consider choosing lower carbohydrate beverages Continue with exercise plan of walking with co-worker during lunch break Continue with preparation of lower fat meats at home and no fat added to sandwiches or salads Consider using APPs on phone such as Calorie Brooke Dare and Seven Minute Workout  Handouts given during visit include: Carb Counting and Food Label handouts Meal Plan Card  APP information for Calorie Brooke Dare and Seven Minute Workout  Monitoring/Evaluation:  Dietary intake, exercise, reading food labels, and body weight in 7 week(s).

## 2011-11-13 NOTE — Patient Instructions (Addendum)
Plan: Aim for 4 Carb choices (60 grams) per meal +/- 1 either way Aim for 0-2 Carbs per snack if hungry Consider reading food labels for total carbohydrate of foods Consider choosing lower carbohydrate beverages Continue with exercise plan of walking with co-worker during lunch break Continue with preparation of lower fat meats at home and no fat added to sandwiches or salads

## 2011-12-04 ENCOUNTER — Encounter: Payer: Self-pay | Admitting: Internal Medicine

## 2011-12-04 ENCOUNTER — Ambulatory Visit (HOSPITAL_BASED_OUTPATIENT_CLINIC_OR_DEPARTMENT_OTHER)
Admission: RE | Admit: 2011-12-04 | Discharge: 2011-12-04 | Disposition: A | Discharge status: Home / Self Care | Payer: 59 | Source: Ambulatory Visit | Attending: Internal Medicine | Admitting: Internal Medicine

## 2011-12-04 ENCOUNTER — Ambulatory Visit (INDEPENDENT_AMBULATORY_CARE_PROVIDER_SITE_OTHER): Payer: 59 | Admitting: Internal Medicine

## 2011-12-04 ENCOUNTER — Telehealth: Payer: Self-pay | Admitting: *Deleted

## 2011-12-04 ENCOUNTER — Ambulatory Visit: Payer: 59 | Admitting: Family

## 2011-12-04 VITALS — BP 132/92 | HR 84 | Temp 98.2°F | Resp 18 | Wt 287.5 lb

## 2011-12-04 DIAGNOSIS — R0602 Shortness of breath: Secondary | ICD-10-CM | POA: Insufficient documentation

## 2011-12-04 DIAGNOSIS — R112 Nausea with vomiting, unspecified: Secondary | ICD-10-CM | POA: Insufficient documentation

## 2011-12-04 DIAGNOSIS — R0609 Other forms of dyspnea: Secondary | ICD-10-CM

## 2011-12-04 DIAGNOSIS — R0600 Dyspnea, unspecified: Secondary | ICD-10-CM | POA: Insufficient documentation

## 2011-12-04 DIAGNOSIS — M329 Systemic lupus erythematosus, unspecified: Secondary | ICD-10-CM | POA: Insufficient documentation

## 2011-12-04 LAB — HEPATIC FUNCTION PANEL
Albumin: 4.4 g/dL (ref 3.5–5.2)
Alkaline Phosphatase: 52 U/L (ref 39–117)
Indirect Bilirubin: 0.7 mg/dL (ref 0.0–0.9)
Total Bilirubin: 0.9 mg/dL (ref 0.3–1.2)
Total Protein: 6.8 g/dL (ref 6.0–8.3)

## 2011-12-04 LAB — CBC WITH DIFFERENTIAL/PLATELET
Basophils Relative: 1 % (ref 0–1)
Eosinophils Absolute: 0.1 10*3/uL (ref 0.0–0.7)
Eosinophils Relative: 2 % (ref 0–5)
MCH: 24.7 pg — ABNORMAL LOW (ref 26.0–34.0)
MCHC: 33.9 g/dL (ref 30.0–36.0)
MCV: 72.9 fL — ABNORMAL LOW (ref 78.0–100.0)
Neutrophils Relative %: 57 % (ref 43–77)
Platelets: 335 10*3/uL (ref 150–400)
RDW: 14 % (ref 11.5–15.5)

## 2011-12-04 LAB — URINALYSIS, ROUTINE W REFLEX MICROSCOPIC
Hgb urine dipstick: NEGATIVE
Ketones, ur: NEGATIVE mg/dL
Leukocytes, UA: NEGATIVE
Nitrite: NEGATIVE
Specific Gravity, Urine: 1.014 (ref 1.005–1.030)
Urobilinogen, UA: 1 mg/dL (ref 0.0–1.0)
pH: 7.5 (ref 5.0–8.0)

## 2011-12-04 LAB — BASIC METABOLIC PANEL
CO2: 27 mEq/L (ref 19–32)
Calcium: 9.4 mg/dL (ref 8.4–10.5)
Creat: 0.86 mg/dL (ref 0.50–1.35)
Sodium: 140 mEq/L (ref 135–145)

## 2011-12-04 LAB — LIPASE: Lipase: <10 U/L (ref 0–75)

## 2011-12-04 LAB — AMYLASE: Amylase: 53 U/L (ref 0–105)

## 2011-12-04 NOTE — Assessment & Plan Note (Signed)
Consider viral gastroenteritis however with his PMH recommend further evaluation. Obtain cbc, chem7, lft, amylase, lipase and UA. Present to ED with worsening sx's. Take zofran prn.

## 2011-12-04 NOTE — Telephone Encounter (Signed)
Received call from [facility] that STAT labs from today are final except BNP is still pending. They will call when result is final. All other results are in EPIC. Please advise.

## 2011-12-04 NOTE — Progress Notes (Signed)
  Subjective:    Patient ID: Thomas Rivera, male    DOB: 02-05-1986, 26 y.o.   MRN: [id number]  HPI Pt presents to clinic for evaluation of nausea. Noted onset this am of nausea with single episode of emesis after trying to eat. Also notes intermittent dizziness and mild DOE. No hemoptysis, abdominal pain, fever, chills, cough, urinary sx's or change in bowel habits. No recent illness or sick exposure. No other alleviating or exacerbating factors. Has zofran at home.   Past Medical History  Diagnosis Date  . History of chicken pox     childhood  . GERD (gastroesophageal reflux disease)   . Seasonal allergies   . Migraines      1-2 per month  . Obesity    Past Surgical History  Procedure Date  . Wisdom tooth extraction 2009    reports that he has never smoked. He has never used smokeless tobacco. He reports that he drinks alcohol. He reports that he does not use illicit drugs. family history includes Arthritis in an unspecified family member; Diabetes in an unspecified family member; Healthy in his brother; and Hypertension in his maternal grandmother. Allergies  Allergen Reactions  . Amoxicillin     Severe swelling  . Bee Venom Swelling  . Nutritional Supplements     Severe swelling of sting site     Review of Systems see hpi     Objective:   Physical Exam  Nursing note and vitals reviewed. Constitutional: He appears well-developed. No distress.  HENT:  Head: Normocephalic and atraumatic.  Right Ear: External ear normal.  Left Ear: External ear normal.  Eyes: Conjunctivae are normal. No scleral icterus.  Neck: Neck supple. No JVD present.  Cardiovascular: Normal rate, regular rhythm and normal heart sounds.  Exam reveals no gallop and no friction rub.   No murmur heard. Pulmonary/Chest: Effort normal and breath sounds normal. No respiratory distress. He has no wheezes. He has no rales.  Abdominal: Soft. Normal appearance and bowel sounds are normal. He exhibits no  distension, no ascites and no mass. There is no hepatosplenomegaly. There is tenderness in the periumbilical area. There is no rigidity, no rebound and no guarding.       Minimal discomfort to palptation periumbilical area  Lymphadenopathy:    He has no cervical adenopathy.  Neurological: He is alert.  Skin: Skin is warm and dry. He is not diaphoretic.  Psychiatric: He has a normal mood and affect.          Assessment & Plan:

## 2011-12-04 NOTE — Telephone Encounter (Signed)
Already reviewed and sent note to [facility] for notification earlier

## 2011-12-04 NOTE — Assessment & Plan Note (Signed)
Obtain cxr pa/lat as well as BNP

## 2012-01-01 ENCOUNTER — Ambulatory Visit: Payer: 59 | Admitting: *Deleted

## 2012-05-02 ENCOUNTER — Ambulatory Visit (INDEPENDENT_AMBULATORY_CARE_PROVIDER_SITE_OTHER): Payer: 59 | Admitting: Internal Medicine

## 2012-05-02 ENCOUNTER — Encounter: Payer: Self-pay | Admitting: Internal Medicine

## 2012-05-02 VITALS — BP 130/96 | HR 83 | Temp 98.0°F | Resp 16 | Wt 280.2 lb

## 2012-05-02 DIAGNOSIS — M329 Systemic lupus erythematosus, unspecified: Secondary | ICD-10-CM

## 2012-05-02 DIAGNOSIS — I10 Essential (primary) hypertension: Secondary | ICD-10-CM

## 2012-05-09 NOTE — Progress Notes (Signed)
  Subjective:    Patient ID: Thomas Rivera, male    DOB: [birth year], 27 y.o.   MRN: [id number]  HPI appt cancelled    Review of Systems     Objective:   Physical Exam        Assessment & Plan:

## 2012-05-12 DIAGNOSIS — I10 Essential (primary) hypertension: Secondary | ICD-10-CM | POA: Insufficient documentation

## 2012-05-12 DIAGNOSIS — M329 Systemic lupus erythematosus, unspecified: Secondary | ICD-10-CM | POA: Insufficient documentation

## 2012-05-12 NOTE — Assessment & Plan Note (Signed)
Asx. Recommend outpt bp log to be forwarded to clinic for review. If remains elevated then increase losartan.

## 2012-05-12 NOTE — Progress Notes (Signed)
  Subjective:    Patient ID: Thomas Rivera, male    DOB: [birth year], 27 y.o.   MRN: [id number]  HPI Pt presents to clinic for followup of multiple medical problems. BP reviewed elevated without sx's. No recent other bp values. Compliant with medication without adverse effect. Followed by rheumatology and nephrology for h/o lupus.   Past Medical History  Diagnosis Date  . History of chicken pox     childhood  . GERD (gastroesophageal reflux disease)   . Seasonal allergies   . Migraines      1-2 per month  . Obesity    Past Surgical History  Procedure Date  . Wisdom tooth extraction 2009    reports that he has never smoked. He has never used smokeless tobacco. He reports that he drinks alcohol. He reports that he does not use illicit drugs. family history includes Arthritis in an unspecified family member; Diabetes in an unspecified family member; Healthy in his brother; and Hypertension in his maternal grandmother. Allergies  Allergen Reactions  . Amoxicillin     Severe swelling  . Bee Venom Swelling  . Nutritional Supplements     Severe swelling of sting site      Review of Systems see hpi     Objective:   Physical Exam  Nursing note and vitals reviewed. Constitutional: He appears well-developed and well-nourished. No distress.  HENT:  Head: Normocephalic and atraumatic.  Neurological: He is alert.  Skin: He is not diaphoretic.  Psychiatric: He has a normal mood and affect.          Assessment & Plan:

## 2012-06-20 ENCOUNTER — Emergency Department (HOSPITAL_COMMUNITY)
Admission: EM | Admit: 2012-06-20 | Discharge: 2012-06-20 | Disposition: A | Discharge status: Home / Self Care | Payer: 59 | Source: Home / Self Care | Attending: Emergency Medicine | Admitting: Emergency Medicine

## 2012-06-20 ENCOUNTER — Encounter (HOSPITAL_COMMUNITY): Payer: Self-pay | Admitting: *Deleted

## 2012-06-20 DIAGNOSIS — K529 Noninfective gastroenteritis and colitis, unspecified: Secondary | ICD-10-CM

## 2012-06-20 MED ORDER — ONDANSETRON HCL 4 MG/2ML IJ SOLN
INTRAMUSCULAR | Status: AC
  Filled 2012-06-20: qty 2

## 2012-06-20 MED ORDER — ONDANSETRON HCL 4 MG/2ML IJ SOLN
4.0000 mg | Freq: Once | INTRAMUSCULAR | Status: AC
  Administered 2012-06-20: 4 mg via INTRAMUSCULAR

## 2012-06-20 MED ORDER — ONDANSETRON HCL 4 MG PO TABS: 4.0000 mg | ORAL_TABLET | Freq: Four times a day (QID) | ORAL | Status: AC | PRN

## 2012-06-20 NOTE — ED Notes (Signed)
Pt  Has  Symptoms  Of nausea   /  Vomiting /  Diarrhea            Symptoms       Began  yest t  He  Also  Reports  Symptoms  Of  malaise  As  Well            Pt  Reports  He  Has      Vomited  approx  10  Times  Today  And  He reports   4  Bouts  Of  diarrhea

## 2012-06-20 NOTE — ED Provider Notes (Signed)
History     CSN: [id number]  Arrival date & time 06/20/12  1014   First MD Initiated Contact with Patient 06/20/12 1211      Chief Complaint  Patient presents with  . Nausea    (Consider location/radiation/quality/duration/timing/severity/associated sxs/prior treatment) HPI Comments: Pt visited daughter this weekend who had similar illness/sx.   Patient is a 27 y.o. male presenting with vomiting. The history is provided by the patient.  Emesis Severity:  Moderate Duration:  12 hours Timing:  Intermittent Quality:  Stomach contents Progression:  Unchanged Chronicity:  New Recent urination:  Decreased Relieved by:  None tried Worsened by:  Liquids Ineffective treatments:  Liquids Associated symptoms: abdominal pain, chills and diarrhea   Associated symptoms: no fever and no myalgias   Diarrhea:    Quality:  Watery   Severity:  Moderate   Duration:  12 hours   Timing:  Intermittent   Progression:  Improving Risk factors: sick contacts     Past Medical History  Diagnosis Date  . History of chicken pox     childhood  . GERD (gastroesophageal reflux disease)   . Seasonal allergies   . Migraines      1-2 per month  . Obesity     Past Surgical History  Procedure Laterality Date  . Wisdom tooth extraction  2009    Family History  Problem Relation Age of Onset  . Arthritis      maternal great grandmother  . Hypertension Maternal Grandmother   . Diabetes      maternal great grandmother  . Healthy Brother     half     History  Substance Use Topics  . Smoking status: Never Smoker   . Smokeless tobacco: Never Used  . Alcohol Use: Yes     Comment: socially      Review of Systems  Constitutional: Positive for chills. Negative for fever.  Gastrointestinal: Positive for vomiting, abdominal pain and diarrhea. Negative for blood in stool and abdominal distention.  Musculoskeletal: Negative for myalgias.    Allergies  Amoxicillin; Bee venom; and  Nutritional supplements  Home Medications   Current Outpatient Rx  Name  Route  Sig  Dispense  Refill  . furosemide (LASIX) 20 MG tablet      One a day in the morning by mouth as needed for swelling         . Hydroxychloroquine Sulfate (PLAQUENIL PO)   Oral   Take 200 mg by mouth 2 (two) times daily.          Marland Kitchen losartan (COZAAR) 25 MG tablet   Oral   Take 25 mg by mouth daily.         . ondansetron (ZOFRAN) 4 MG tablet   Oral   Take 1 tablet (4 mg total) by mouth every 6 (six) hours as needed for nausea.   12 tablet   0   . predniSONE (DELTASONE) 10 MG tablet   Oral   Take 5 mg by mouth daily.            BP 90/53  Pulse 104  Temp(Src) 98.2 F (36.8 C) (Oral)  Resp 20  SpO2 100%  Physical Exam  Constitutional: He appears well-developed and well-nourished. He appears ill. No distress.  Cardiovascular: Regular rhythm.  Tachycardia present.   Pulmonary/Chest: Effort normal and breath sounds normal.  Abdominal: Soft. He exhibits no distension. Bowel sounds are increased. There is generalized tenderness. There is no rigidity, no rebound, no guarding and  no CVA tenderness.    ED Course  Procedures (including critical care time)  Labs Reviewed - No data to display No results found.   1. Gastroenteritis       MDM  Pt given zofran (IM at pt's request) and thereafter tolerated po fluids well.  Discussed diet, electrolyte replacement, self mgmt at home. Rx zofran for home. Pt felt better after po fluids and zofran.         Cathlyn Parsons, NP 06/20/12 [phone]

## 2012-06-21 NOTE — ED Provider Notes (Signed)
Medical screening examination/treatment/procedure(s) were performed by non-physician practitioner and as supervising physician I was immediately available for consultation/collaboration.  David Keller, M.D.  David C Keller, MD 06/21/12 0932 

## 2012-08-01 ENCOUNTER — Ambulatory Visit: Payer: 59 | Admitting: Family Medicine

## 2012-08-08 ENCOUNTER — Ambulatory Visit: Payer: 59 | Admitting: Family Medicine

## 2012-08-08 DIAGNOSIS — Z0289 Encounter for other administrative examinations: Secondary | ICD-10-CM

## 2013-11-07 IMAGING — US US ABDOMEN COMPLETE
1 series · 14 of 25 positions shown · non-contrast
Comparison: None.

CLINICAL DATA: Abnormal liver function tests.  Abdominal pain and
swelling.

COMPLETE ABDOMINAL ULTRASOUND

[Series 1: us abdomen complete · 0.39mm/px · 14 of 56 slices shown]
[im 1/56]
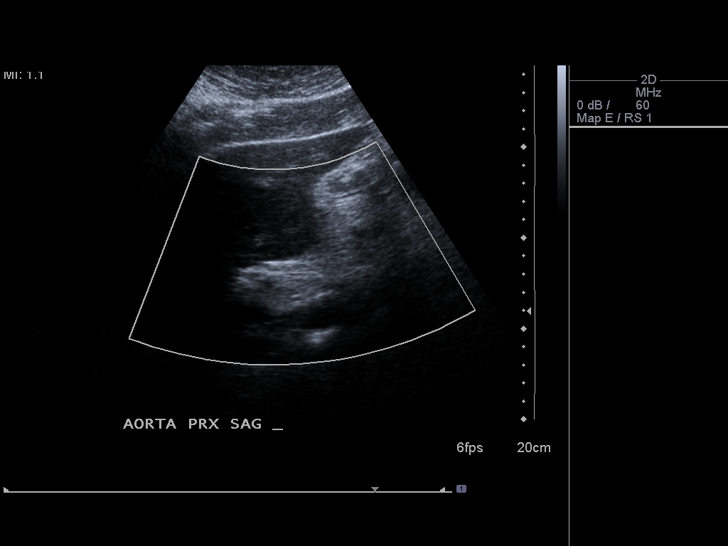
[im 5/56]
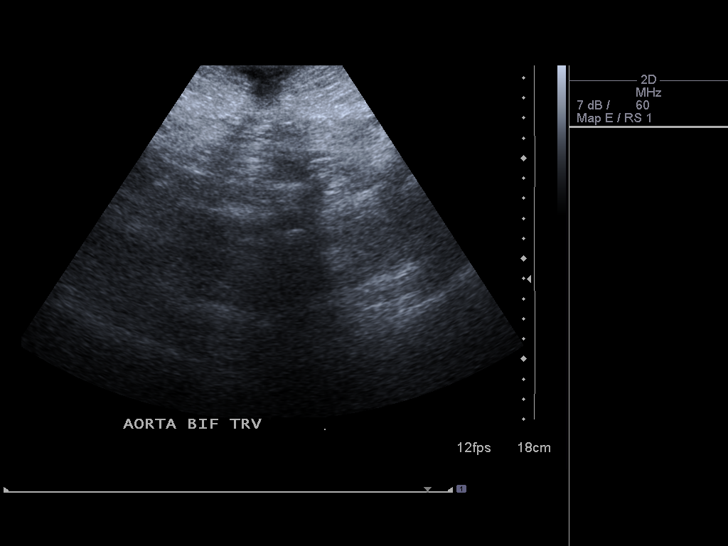
[im 10/56]
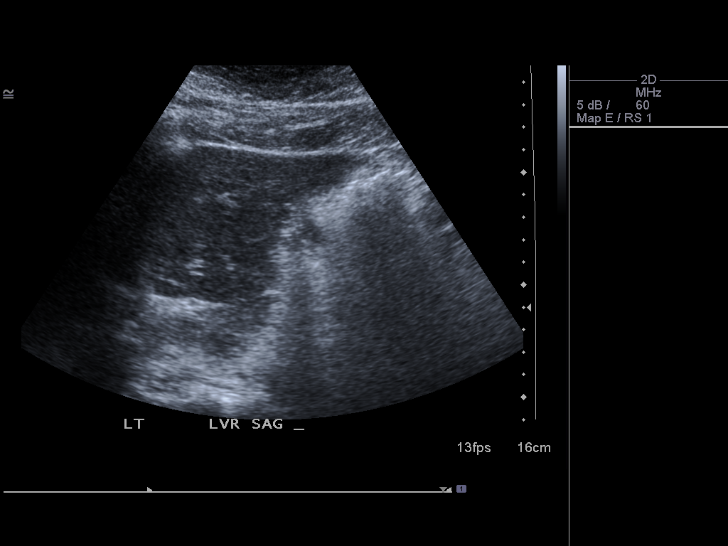
[im 14/56]
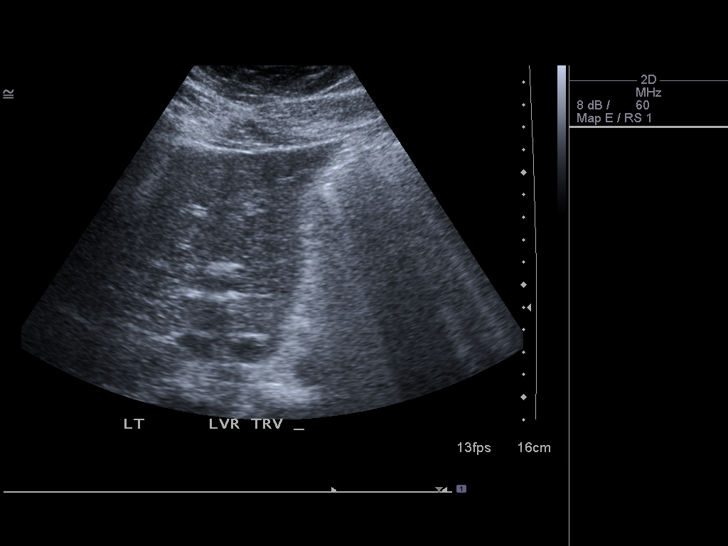
[im 19/56]
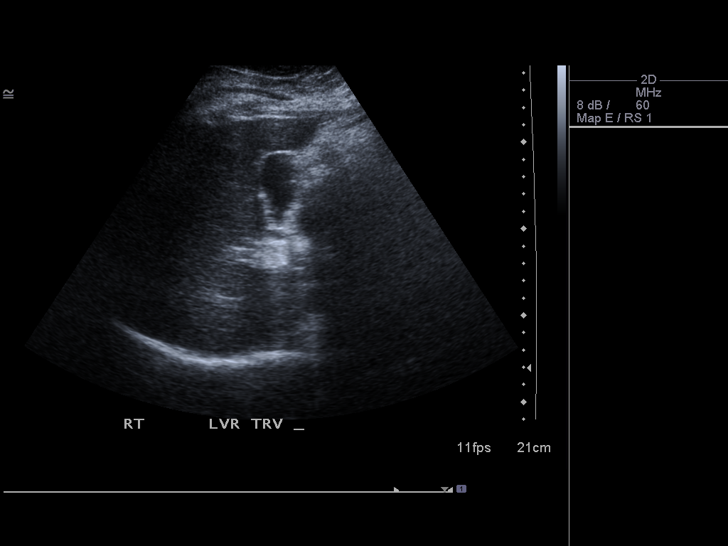
[im 21/56]
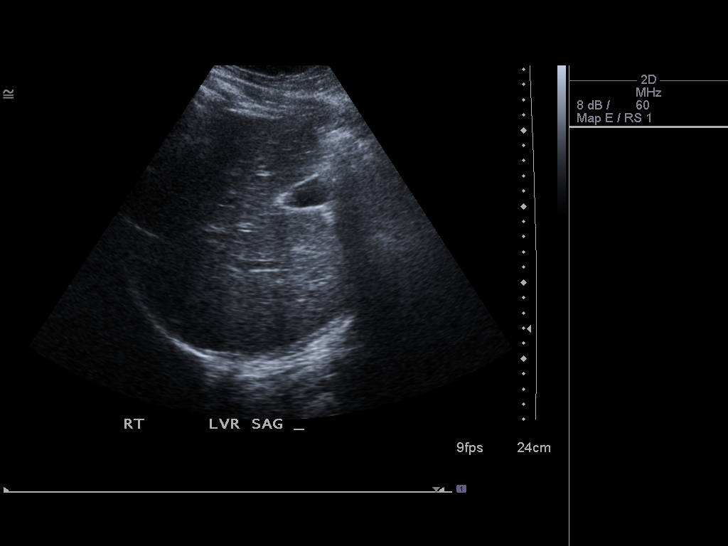
[im 26/56]
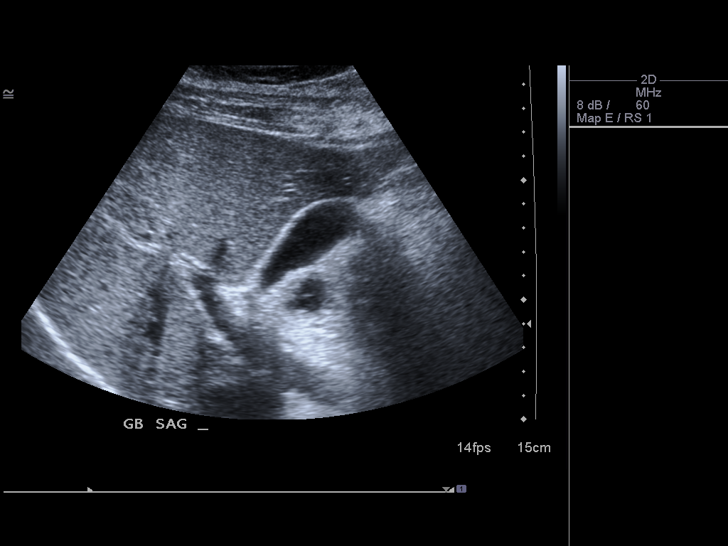
[im 30/56]
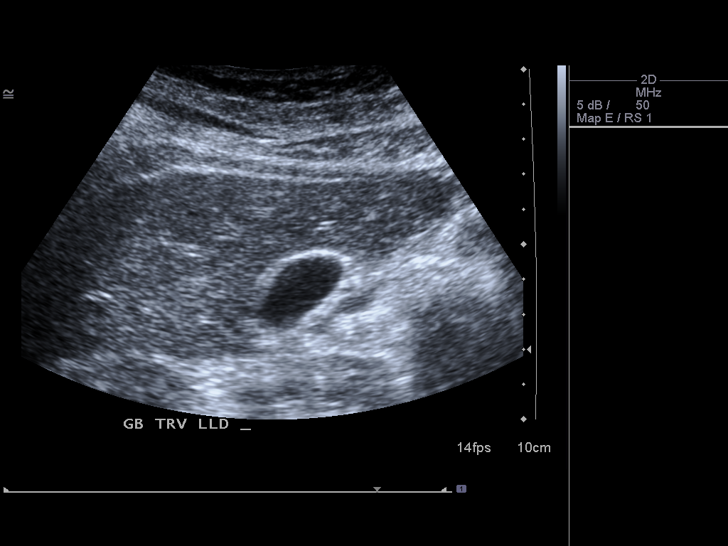
[im 35/56]
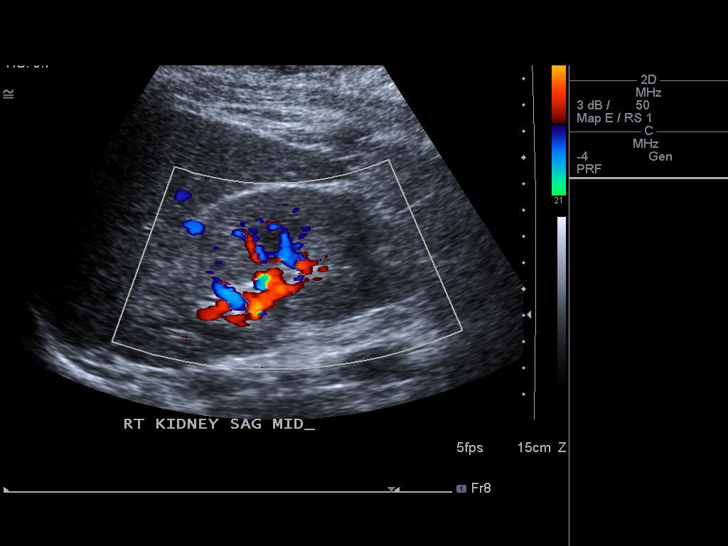
[im 37/56]
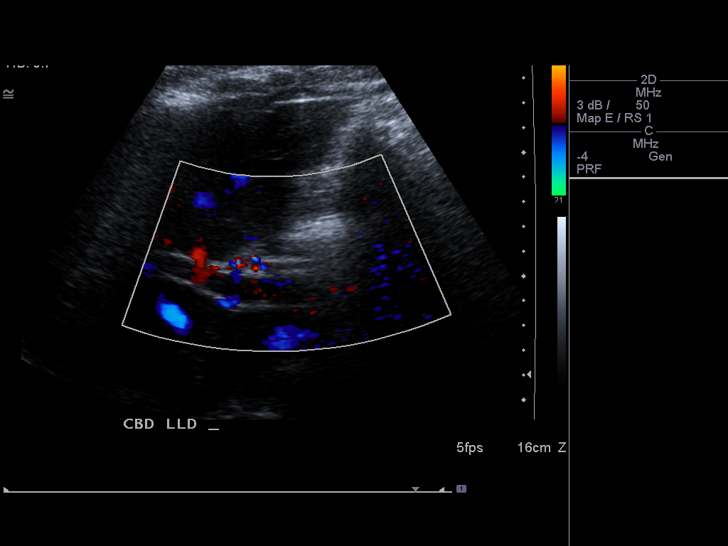
[im 42/56]
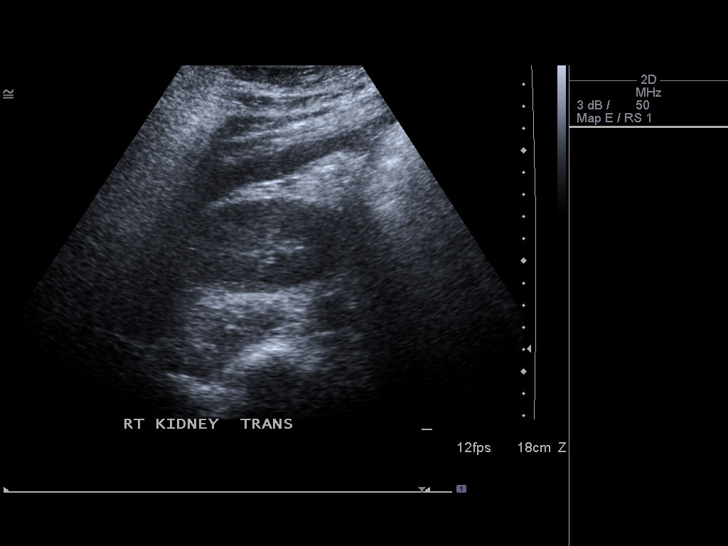
[im 46/56]
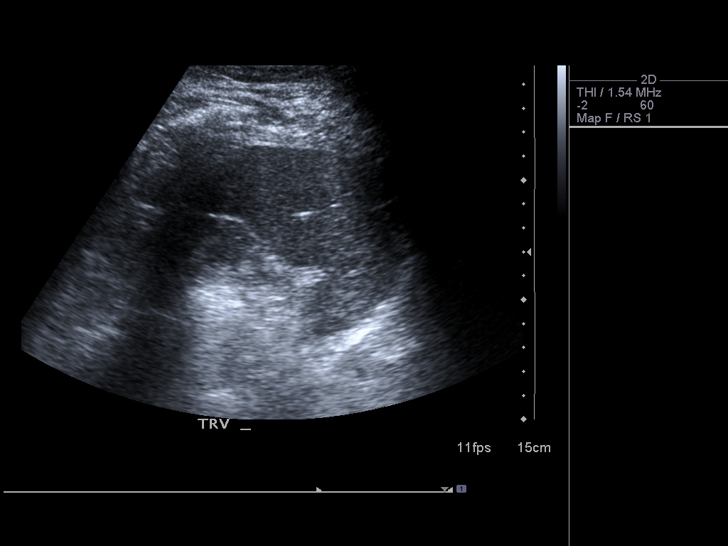
[im 51/56]
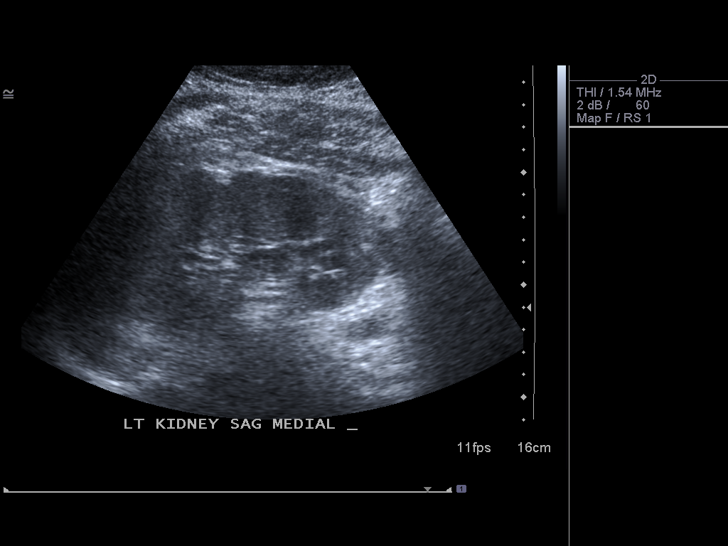
[im 56/56]
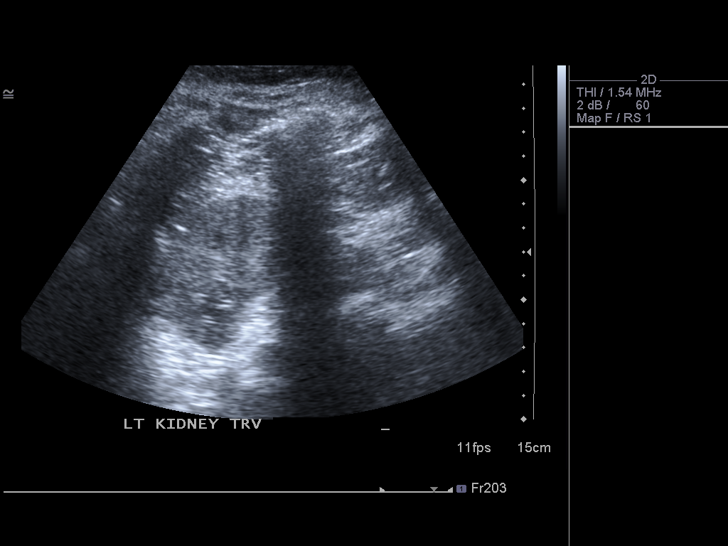

[14 of 25 positions shown; findings below may reference images not displayed]

FINDINGS: Gallbladder:  No gallstones, gallbladder wall thickening, or
pericholecystic fluid.

Common bile duct:  Measures 0.4 cm.

Liver:  No focal lesion identified.  Within normal limits in
parenchymal echogenicity.

IVC:  Appears normal.

Pancreas:  No focal abnormality seen.

Spleen:  Measures 10.9 cm and appears normal.

Right Kidney:  Measures 13.3 cm and appears normal.

Left Kidney:  Measures 12.1 cm and appears normal.

Abdominal aorta:  No aneurysm identified.
IMPRESSION: Negative abdominal ultrasound.

## 2018-10-03 ENCOUNTER — Emergency Department (HOSPITAL_COMMUNITY): Payer: PRIVATE HEALTH INSURANCE

## 2018-10-03 ENCOUNTER — Emergency Department (HOSPITAL_COMMUNITY)
Admission: EM | Admit: 2018-10-03 | Discharge: 2018-10-03 | Disposition: A | Discharge status: Home / Self Care | Payer: PRIVATE HEALTH INSURANCE | Attending: Emergency Medicine | Admitting: Emergency Medicine

## 2018-10-03 ENCOUNTER — Encounter (HOSPITAL_COMMUNITY): Payer: Self-pay

## 2018-10-03 ENCOUNTER — Other Ambulatory Visit: Payer: Self-pay

## 2018-10-03 DIAGNOSIS — M545 Low back pain, unspecified: Secondary | ICD-10-CM

## 2018-10-03 DIAGNOSIS — R1030 Lower abdominal pain, unspecified: Secondary | ICD-10-CM | POA: Diagnosis present

## 2018-10-03 DIAGNOSIS — I10 Essential (primary) hypertension: Secondary | ICD-10-CM | POA: Insufficient documentation

## 2018-10-03 LAB — CBC WITH DIFFERENTIAL/PLATELET
Abs Immature Granulocytes: 0.01 10*3/uL (ref 0.00–0.07)
Basophils Absolute: 0 10*3/uL (ref 0.0–0.1)
Basophils Relative: 1 %
Eosinophils Absolute: 0.1 10*3/uL (ref 0.0–0.5)
Eosinophils Relative: 1 %
HCT: 41 % (ref 39.0–52.0)
Hemoglobin: 13.2 g/dL (ref 13.0–17.0)
Immature Granulocytes: 0 %
Lymphocytes Relative: 55 %
Lymphs Abs: 3.4 10*3/uL (ref 0.7–4.0)
MCH: 25.4 pg — ABNORMAL LOW (ref 26.0–34.0)
MCHC: 32.2 g/dL (ref 30.0–36.0)
MCV: 78.8 fL — ABNORMAL LOW (ref 80.0–100.0)
Monocytes Absolute: 0.7 10*3/uL (ref 0.1–1.0)
Monocytes Relative: 11 %
Neutro Abs: 1.9 10*3/uL (ref 1.7–7.7)
Neutrophils Relative %: 32 %
Platelets: 294 10*3/uL (ref 150–400)
RBC: 5.2 MIL/uL (ref 4.22–5.81)
RDW: 13.7 % (ref 11.5–15.5)
WBC: 6.1 10*3/uL (ref 4.0–10.5)
nRBC: 0 % (ref 0.0–0.2)

## 2018-10-03 LAB — URINALYSIS, ROUTINE W REFLEX MICROSCOPIC
Bilirubin Urine: NEGATIVE
Glucose, UA: NEGATIVE mg/dL
Hgb urine dipstick: NEGATIVE
Ketones, ur: NEGATIVE mg/dL
Leukocytes,Ua: NEGATIVE
Nitrite: NEGATIVE
Protein, ur: NEGATIVE mg/dL
Specific Gravity, Urine: 1.01 (ref 1.005–1.030)
pH: 7 (ref 5.0–8.0)

## 2018-10-03 LAB — BASIC METABOLIC PANEL
Anion gap: 8 (ref 5–15)
BUN: 15 mg/dL (ref 6–20)
CO2: 29 mmol/L (ref 22–32)
Calcium: 9.3 mg/dL (ref 8.9–10.3)
Chloride: 102 mmol/L (ref 98–111)
Creatinine, Ser: 0.85 mg/dL (ref 0.61–1.24)
GFR calc Af Amer: >60 mL/min (ref >60)
GFR calc non Af Amer: >60 mL/min (ref >60)
Glucose, Bld: 96 mg/dL (ref 70–99)
Potassium: 3.5 mmol/L (ref 3.5–5.1)
Sodium: 139 mmol/L (ref 135–145)

## 2018-10-03 MED ORDER — DIAZEPAM 5 MG PO TABS
5.0000 mg | ORAL_TABLET | Freq: Once | ORAL | Status: AC
  Administered 2018-10-03: 5 mg via ORAL
  Filled 2018-10-03: qty 1

## 2018-10-03 MED ORDER — METHOCARBAMOL 500 MG PO TABS: 500.0000 mg | ORAL_TABLET | Freq: Three times a day (TID) | ORAL | 0 refills | Status: DC | PRN

## 2018-10-03 MED ORDER — LIDOCAINE 5 % EX OINT: 1.0000 "application " | TOPICAL_OINTMENT | Freq: Three times a day (TID) | CUTANEOUS | 0 refills | Status: AC | PRN

## 2018-10-03 NOTE — ED Notes (Signed)
Bed: OB49 Expected date:  Expected time:  Means of arrival:  Comments: 33 yo F/shortness of breath-fever poss COVID

## 2018-10-03 NOTE — Discharge Instructions (Signed)

## 2018-10-03 NOTE — ED Notes (Signed)
Urine culture sent to the lab. 

## 2018-10-03 NOTE — ED Provider Notes (Signed)
Emergency Department Provider Note   I have reviewed the triage vital signs and the nursing notes.   HISTORY  Chief Complaint Back Pain   HPI Thomas Rivera is a 33 y.o. male with PMH of HIV, GERD, and Lupus presents to the emergency department for evaluation of lower back pain.  Symptoms began 3 days ago without trauma.  He states that initially pain was radiating down the right leg but that has since stopped.  He continues to have lower back pain which now seems to be radiating around the anterior, lower abdomen.  No clear modifying or provoking factors.  No history of kidney stone.  Patient has had some constipation but is urinating normally.  He denies any fevers or shaking chills.  No weakness or numbness in the lower extremities.  Denies any groin numbness.  No bowel or bladder incontinence.  No urinary retention symptoms.   Past Medical History:  Diagnosis Date  . GERD (gastroesophageal reflux disease)   . History of chicken pox    childhood  . Migraines     1-2 per month  . Obesity   . Seasonal allergies     Patient Active Problem List   Diagnosis Date Noted  . SLE (systemic lupus erythematosus) (HCC) 05/12/2012  . HTN (hypertension) 05/12/2012  . Annual physical exam 01/02/2011  . Low HDL (under 40) 01/02/2011  . Gynecomastia 01/02/2011    Past Surgical History:  Procedure Laterality Date  . WISDOM TOOTH EXTRACTION  2009    Allergies Amoxicillin, Bee venom, and Nutritional supplements  Family History  Problem Relation Age of Onset  . Arthritis Other        maternal great grandmother  . Hypertension Maternal Grandmother   . Diabetes Other        maternal great grandmother  . Healthy Brother        half     Social History Social History   Tobacco Use  . Smoking status: Never Smoker  . Smokeless tobacco: Never Used  Substance Use Topics  . Alcohol use: Yes    Comment: socially  . Drug use: No    Review of Systems  Constitutional: No  fever/chills Eyes: No visual changes. ENT: No sore throat. Cardiovascular: Denies chest pain. Respiratory: Denies shortness of breath. Gastrointestinal: Positive lower abdominal pain.  No nausea, no vomiting.  No diarrhea.  No constipation. Genitourinary: Negative for dysuria. Musculoskeletal: Positive back pain.  Skin: Negative for rash. Neurological: Negative for headaches, focal weakness or numbness.  10-point ROS otherwise negative.  ____________________________________________   PHYSICAL EXAM:  VITAL SIGNS: ED Triage Vitals  Enc Vitals Group     BP 10/03/18 0237 (!) 168/100     Pulse Rate 10/03/18 0237 76     Resp 10/03/18 0237 18     Temp 10/03/18 0237 98.6 F (37 C)     Temp Source 10/03/18 0237 Oral     SpO2 10/03/18 0237 100 %     Weight 10/03/18 0237 290 lb (131.5 kg)     Height 10/03/18 0237 5\' 9"  (1.753 m)     Pain Score 10/03/18 0238 9   Constitutional: Alert and oriented. Well appearing and in no acute distress. Eyes: Conjunctivae are normal.  Head: Atraumatic. Nose: No congestion/rhinnorhea. Mouth/Throat: Mucous membranes are moist.  Neck: No stridor.  Cardiovascular: Normal rate, regular rhythm. Good peripheral circulation. Grossly normal heart sounds.   Respiratory: Normal respiratory effort.   Gastrointestinal: Soft and nontender. No distention.  Musculoskeletal: No lower  extremity tenderness nor edema. No gross deformities of extremities. No focal midline lumbar spine tenderness.  Neurologic:  Normal speech and language. No gross focal neurologic deficits are appreciated.  Skin:  Skin is warm, dry and intact. No rash noted.   ____________________________________________   LABS (all labs ordered are listed, but only abnormal results are displayed)  Labs Reviewed  URINALYSIS, ROUTINE W REFLEX MICROSCOPIC - Abnormal; Notable for the following components:      Result Value   Color, Urine STRAW (*)    All other components within normal limits  CBC  WITH DIFFERENTIAL/PLATELET - Abnormal; Notable for the following components:   MCV 78.8 (*)    MCH 25.4 (*)    All other components within normal limits  BASIC METABOLIC PANEL   ____________________________________________  RADIOLOGY  CT reviewed.   ____________________________________________   PROCEDURES  Procedure(s) performed:   Procedures  None ____________________________________________   INITIAL IMPRESSION / ASSESSMENT AND PLAN / ED COURSE  Pertinent labs & imaging results that were available during my care of the patient were reviewed by me and considered in my medical decision making (see chart for details).   Patient presents to the emergency department for evaluation of lower back pain radiating down to the lower abdomen.  No red flag signs or symptoms to suspect acute spine emergency.  Pain now radiating to the lower abdomen.  Will obtain CT renal along with screening labs given the patient's history of lupus to assess his kidney function.  Will rule out ureteral stone.   Imaging and UA reviewed. Will treat for MSK back pain. No evidence of spine emergency or need for emergency MRI. Discussed return precautions and provided in writing. Patient stable for discharge.  ____________________________________________  FINAL CLINICAL IMPRESSION(S) / ED DIAGNOSES  Final diagnoses:  Acute bilateral low back pain without sciatica     MEDICATIONS GIVEN DURING THIS VISIT:  Medications  diazepam (VALIUM) tablet 5 mg (5 mg Oral Given 10/03/18 0413)     NEW OUTPATIENT MEDICATIONS STARTED DURING THIS VISIT:  Discharge Medication List as of 10/03/2018  6:35 AM    START taking these medications   Details  lidocaine (XYLOCAINE) 5 % ointment Apply 1 application topically 3 (three) times daily as needed., Starting Mon 10/03/2018, Print    methocarbamol (ROBAXIN) 500 MG tablet Take 1 tablet (500 mg total) by mouth every 8 (eight) hours as needed for muscle spasms., Starting  Mon 10/03/2018, Print        Note:  This document was prepared using Dragon voice recognition software and may include unintentional dictation errors.  Nanda Quinton, MD Emergency Medicine    Shamona Wirtz, [facility], MD 10/04/18 [phone]

## 2018-10-03 NOTE — ED Triage Notes (Signed)
Pt complains of low back pain for three days, he states no injury

## 2018-10-05 ENCOUNTER — Emergency Department (HOSPITAL_COMMUNITY)
Admission: EM | Admit: 2018-10-05 | Discharge: 2018-10-05 | Disposition: A | Discharge status: Home / Self Care | Payer: PRIVATE HEALTH INSURANCE | Attending: Emergency Medicine | Admitting: Emergency Medicine

## 2018-10-05 ENCOUNTER — Other Ambulatory Visit: Payer: Self-pay

## 2018-10-05 ENCOUNTER — Encounter (HOSPITAL_COMMUNITY): Payer: Self-pay

## 2018-10-05 DIAGNOSIS — Z21 Asymptomatic human immunodeficiency virus [HIV] infection status: Secondary | ICD-10-CM | POA: Diagnosis not present

## 2018-10-05 DIAGNOSIS — M545 Low back pain, unspecified: Secondary | ICD-10-CM

## 2018-10-05 DIAGNOSIS — Z79899 Other long term (current) drug therapy: Secondary | ICD-10-CM | POA: Insufficient documentation

## 2018-10-05 DIAGNOSIS — I10 Essential (primary) hypertension: Secondary | ICD-10-CM | POA: Diagnosis not present

## 2018-10-05 DIAGNOSIS — M3210 Systemic lupus erythematosus, organ or system involvement unspecified: Secondary | ICD-10-CM | POA: Insufficient documentation

## 2018-10-05 MED ORDER — PREDNISONE 20 MG PO TABS
60.0000 mg | ORAL_TABLET | Freq: Once | ORAL | Status: AC
  Administered 2018-10-05: 60 mg via ORAL
  Filled 2018-10-05: qty 3

## 2018-10-05 MED ORDER — OXYCODONE-ACETAMINOPHEN 5-325 MG PO TABS: 1.0000 | ORAL_TABLET | Freq: Three times a day (TID) | ORAL | 0 refills | Status: DC | PRN

## 2018-10-05 MED ORDER — PREDNISONE 20 MG PO TABS: 40.0000 mg | ORAL_TABLET | Freq: Every day | ORAL | 0 refills | Status: DC

## 2018-10-05 NOTE — ED Provider Notes (Signed)
[facility] DEPT Provider Note   CSN: [id number] Arrival date & time: 10/05/18  1602    History   Chief Complaint Chief Complaint  Patient presents with  . Back Pain  . Groin Pain    HPI Thomas Rivera is a 33 y.o. male.     HPI Patient presents with back pain.  Has had over the last week.  Lower back and radiates around to the groin.  No testicular pain.  No dysuria.  States there was some pain with having bowel movement.  Seen in the ER for it yesterday and had lab work reassuring and negative CT scan.  No fevers.  States there was some pain with bowel movement today but he had not had problems with that up until today.  No dysuria.  No penile discharge.  Has known lupus and HIV but has reassuring CD4 count.  No fevers or chills.  States he is on no medicines for the lupus and is well controlled. Past Medical History:  Diagnosis Date  . GERD (gastroesophageal reflux disease)   . History of chicken pox    childhood  . Migraines     1-2 per month  . Obesity   . Seasonal allergies     Patient Active Problem List   Diagnosis Date Noted  . SLE (systemic lupus erythematosus) ([place]) 05/12/2012  . HTN (hypertension) 05/12/2012  . Annual physical exam 01/02/2011  . Low HDL (under 40) 01/02/2011  . Gynecomastia 01/02/2011    Past Surgical History:  Procedure Laterality Date  . [place] EXTRACTION  2009        Home Medications    Prior to Admission medications   Medication Sig Start Date End Date Taking? Authorizing Provider  amLODipine (NORVASC) 10 MG tablet Take 10 mg by mouth daily.  09/02/18  Yes [provider]  Aspirin-Salicylamide-Caffeine (BC HEADACHE PO) Take 1 packet by mouth daily as needed (pain).   Yes [provider]  methocarbamol (ROBAXIN) 500 MG tablet Take 1 tablet (500 mg total) by mouth every 8 (eight) hours as needed for muscle spasms. 10/03/18  Yes Long, Wonda Olds, MD  TRIUMEQ 600-50-300 MG tablet  Take 1 tablet by mouth daily.  08/31/18  Yes [provider]  lidocaine (XYLOCAINE) 5 % ointment Apply 1 application topically 3 (three) times daily as needed. Patient not taking: Reported on 10/05/2018 10/03/18   Long, Wonda Olds, MD  ondansetron (ZOFRAN) 4 MG tablet Take 1 tablet (4 mg total) by mouth every 6 (six) hours as needed for nausea. Patient not taking: Reported on 10/05/2018 06/20/12   Carvel Getting, NP  oxyCODONE-acetaminophen (PERCOCET/ROXICET) 5-325 MG tablet Take 1-2 tablets by mouth every 8 (eight) hours as needed for severe pain. 10/05/18   Davonna Belling, MD  predniSONE (DELTASONE) 20 MG tablet Take 2 tablets (40 mg total) by mouth daily. 10/06/18   Davonna Belling, MD  Vitamin D, Ergocalciferol, (DRISDOL) 1.25 MG (50000 UT) CAPS capsule Take 50,000 Units by mouth every 7 (seven) days.  08/04/18   [provider]    Family History Family History  Problem Relation Age of Onset  . Arthritis Other        maternal great grandmother  . Hypertension Maternal Grandmother   . Diabetes Other        maternal great grandmother  . Healthy Brother        half     Social History Social History   Tobacco Use  . Smoking  status: Never Smoker  . Smokeless tobacco: Never Used  Substance Use Topics  . Alcohol use: Yes    Comment: socially  . Drug use: No     Allergies   Amoxicillin, Bee venom, and Nutritional supplements   Review of Systems Review of Systems  Constitutional: Negative for appetite change, fatigue and fever.  Respiratory: Negative for shortness of breath.   Cardiovascular: Negative for chest pain.  Gastrointestinal: Negative for abdominal pain.  Genitourinary: Negative for discharge, penile swelling and testicular pain.  Musculoskeletal: Positive for back pain.  Skin: Negative for rash.  Neurological: Negative for dizziness and weakness.  Psychiatric/Behavioral: Negative for confusion.     Physical Exam Updated Vital Signs BP (!)  166/109   Pulse 74   Temp 99 F (37.2 C) (Oral)   Resp 16   SpO2 100%   Physical Exam Vitals signs and nursing note reviewed.  HENT:     Head: Normocephalic.  Neck:     Musculoskeletal: Neck supple.  Cardiovascular:     Rate and Rhythm: Regular rhythm.     Pulses: Normal pulses.  Abdominal:     Tenderness: There is no abdominal tenderness.  Musculoskeletal:     Comments: Some tenderness over lower lumbar spine.  Some pain with movement.  No rash.  No deformity.  No pain with straight leg raise.  Skin:    General: Skin is warm.     Capillary Refill: Capillary refill takes less than 2 seconds.  Neurological:     Mental Status: He is alert. Mental status is at baseline.      ED Treatments / Results  Labs (all labs ordered are listed, but only abnormal results are displayed) Labs Reviewed - No data to display  EKG None  Radiology No results found.  Procedures Procedures (including critical care time)  Medications Ordered in ED Medications  predniSONE (DELTASONE) tablet 60 mg (has no administration in time range)     Initial Impression / Assessment and Plan / ED Course  I have reviewed the triage vital signs and the nursing notes.  Pertinent labs & imaging results that were available during my care of the patient were reviewed by me and considered in my medical decision making (see chart for details).        Patient with low back pain.  Recent reassuring CT yesterday.  Urine and white count reassuring yesterday.  Did have some pain with bowel movement today.  CT scan yesterday did not show constipation.  Reassuring GI tract yesterday.  Discharge home with steroids.  Outpatient follow-up as needed.  Final Clinical Impressions(s) / ED Diagnoses   Final diagnoses:  Acute bilateral low back pain without sciatica    ED Discharge Orders         Ordered    predniSONE (DELTASONE) 20 MG tablet  Daily     10/05/18 1944    oxyCODONE-acetaminophen  (PERCOCET/ROXICET) 5-325 MG tablet  Every 8 hours PRN     10/05/18 1944           Benjiman CorePickering, Venna Berberich, MD 10/05/18 1949

## 2018-10-05 NOTE — ED Triage Notes (Addendum)
Patient dropped off  by BF.   C/O left/right groin pain and lower back pain.  10/10 tingling pain in groin.   Denies N/V or diarrhea.   Patient states he has not had a normal BM in 5 days ago.   Patient states he had abnormal (very small strands) BM today that he had to force out.   Patient seen on Monday with back pain and abdomen pain and told it was a muscle strain and given muscle relaxer that has not helped. Also did CT and blood work.   Patient denies falls and injuries.   Patient denies numbness or weakness.   Hx. Hernia in abdomen per patient.   A/Ox4 Wheelchair in triage.

## 2021-01-05 IMAGING — CT CT RENAL STONE PROTOCOL
2 of 4 series · 16 of 46 positions shown, 18 images · non-contrast
Comparison: Abdominal ultrasound 08/05/2011

CLINICAL DATA: Flank pain. Bilateral low back pain for 3 days.

EXAM:
CT ABDOMEN AND PELVIS WITHOUT CONTRAST
TECHNIQUE: Multidetector CT imaging of the abdomen and pelvis was performed
following the standard protocol without IV contrast.

[Series 2: axial st · axial · 0.73mm/px · z∈[-458,-8]mm · 13 of 103 slices shown, 15 images]
[im 7/103  soft-tissue]
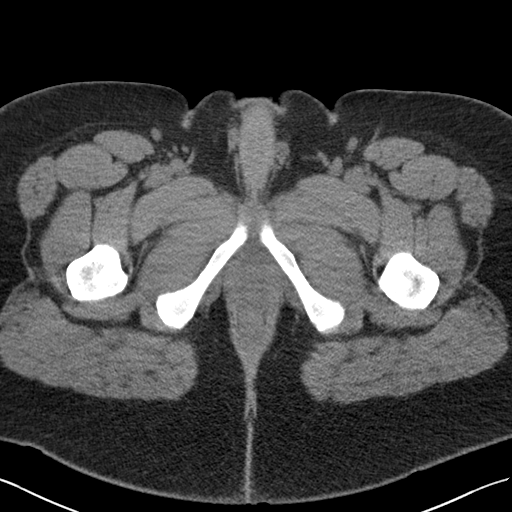
[im 7/103  bone]
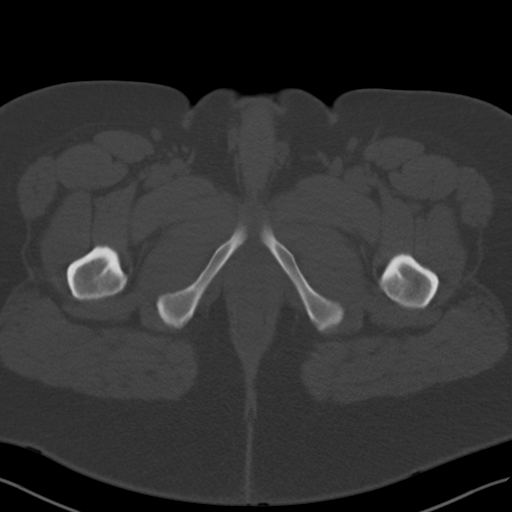
[im 13/103  soft-tissue]
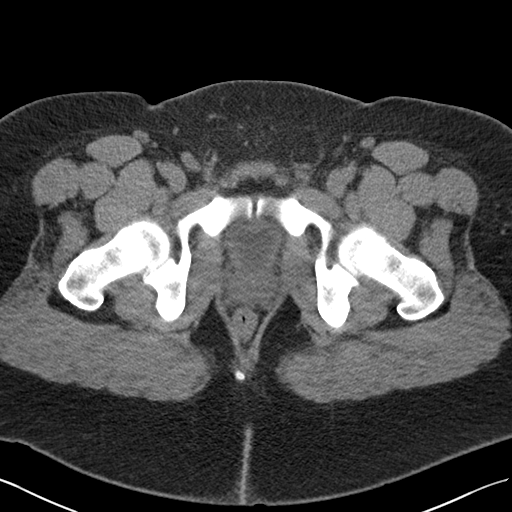
[im 25/103  soft-tissue]
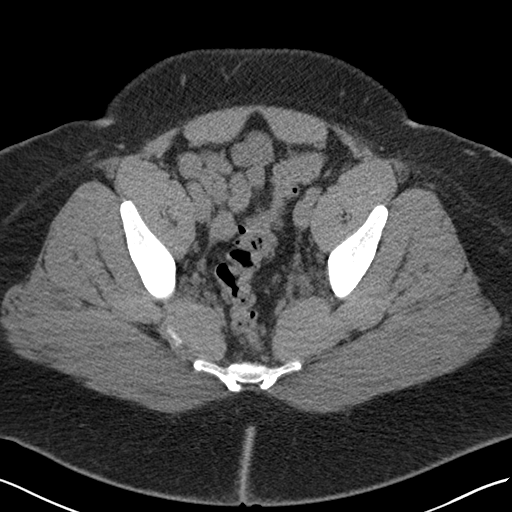
[im 31/103  soft-tissue]
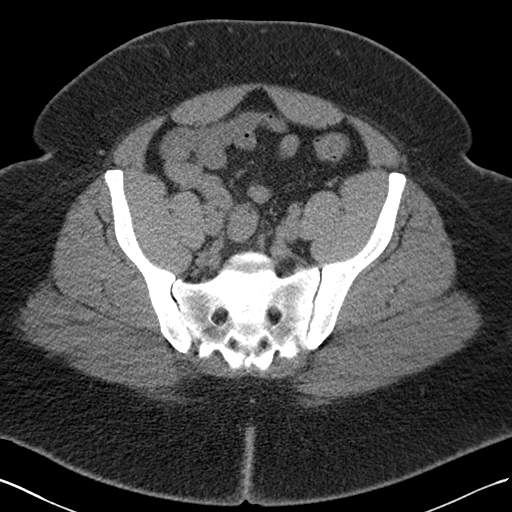
[im 37/103  soft-tissue]
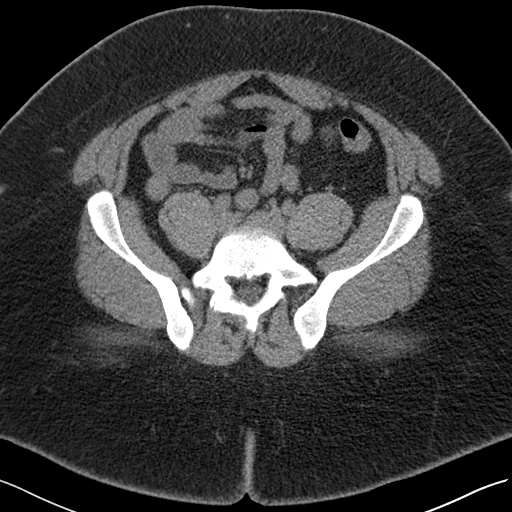
[im 43/103  soft-tissue]
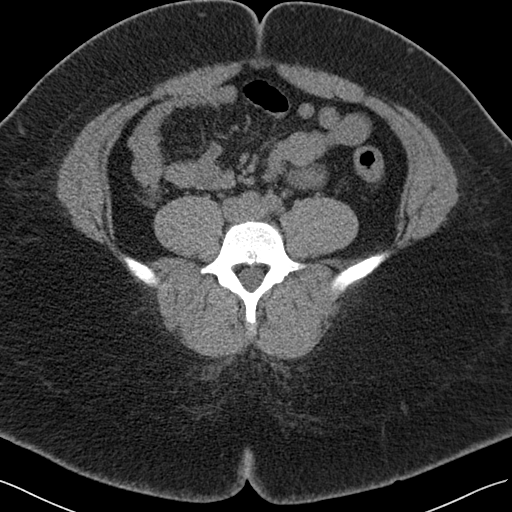
[im 55/103  soft-tissue]
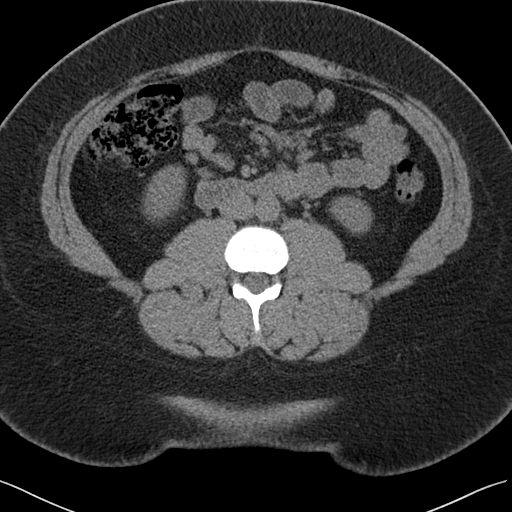
[im 61/103  soft-tissue]
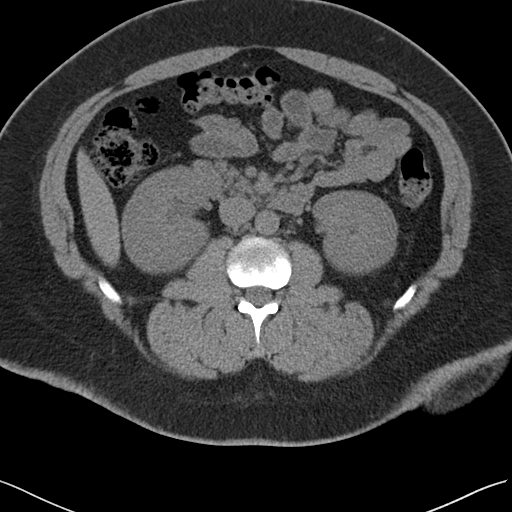
[im 67/103  soft-tissue]
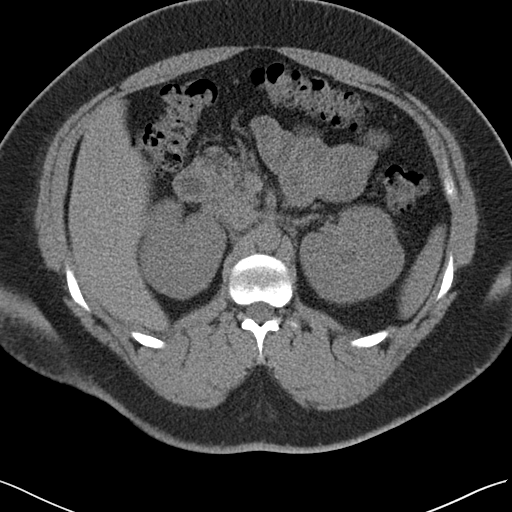
[im 67/103  bone]
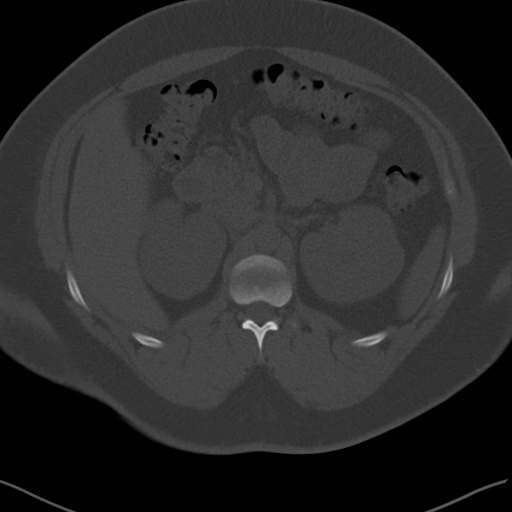
[im 73/103  soft-tissue]
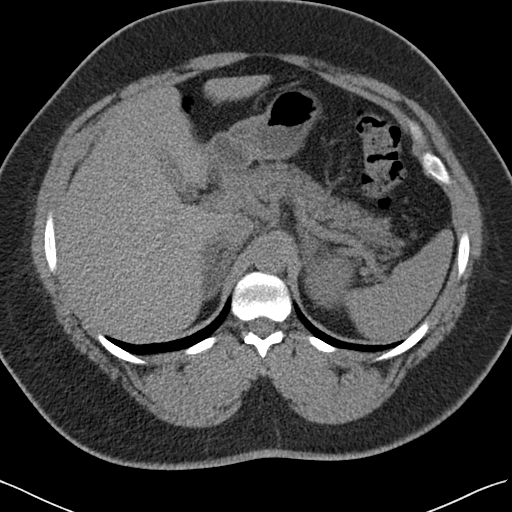
[im 79/103  soft-tissue]
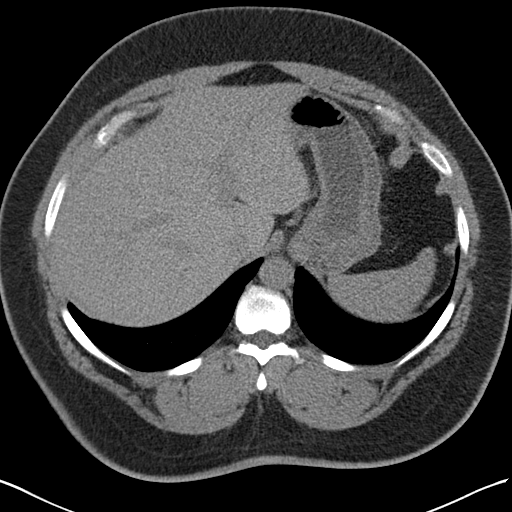
[im 91/103  soft-tissue]
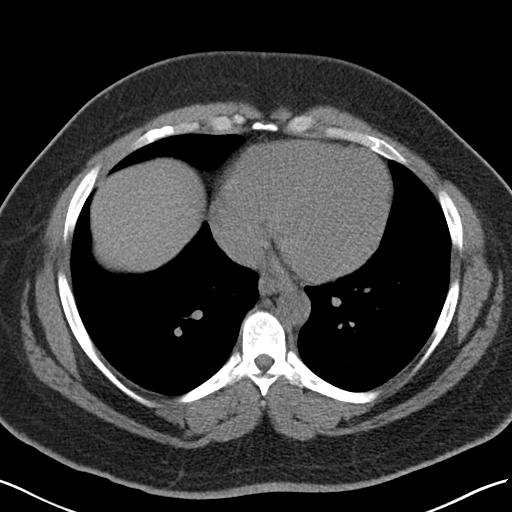
[im 97/103  soft-tissue]
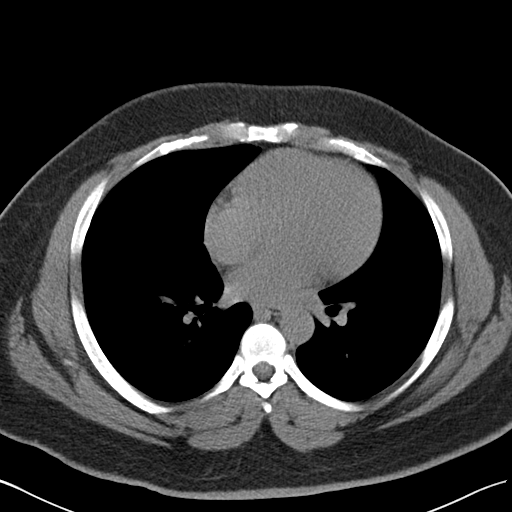

[Series 5: coronal · coronal · 0.87mm/px · 3 of 162 slices shown]
[im 54/162  soft-tissue]
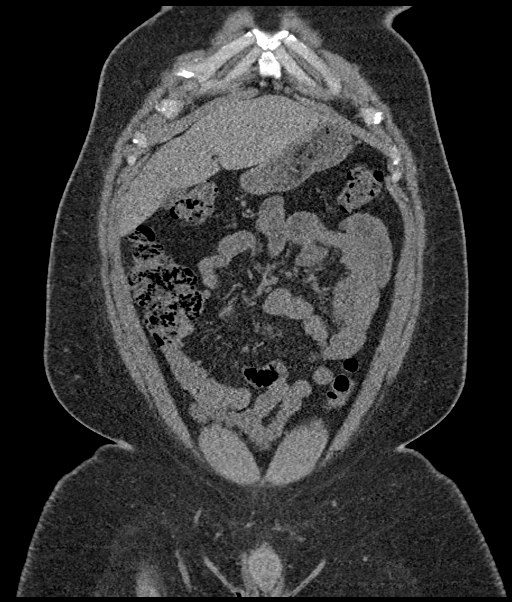
[im 72/162  soft-tissue]
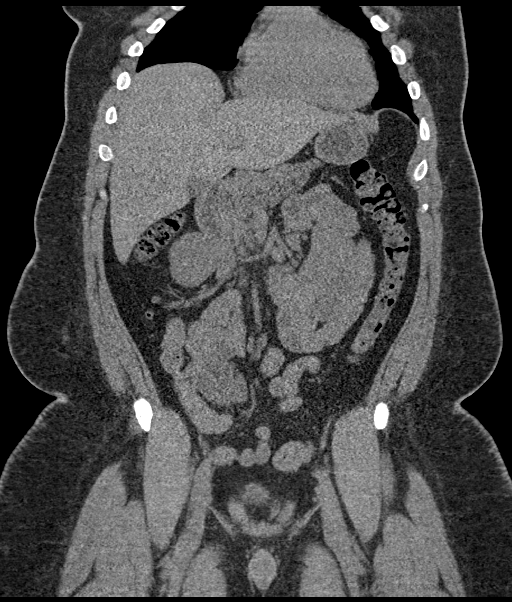
[im 90/162  soft-tissue]
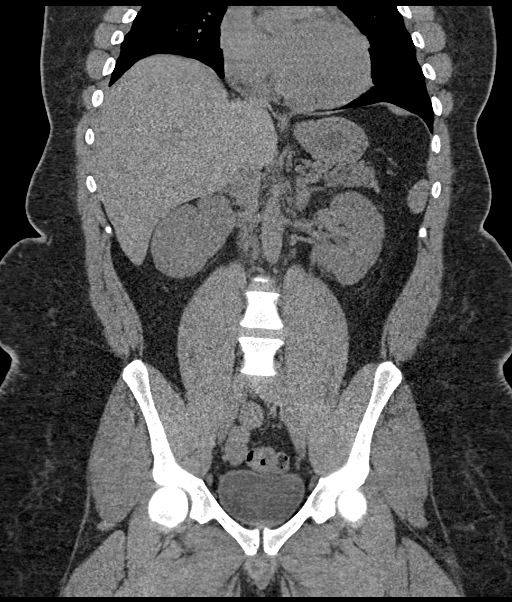

[16 of 46 positions shown; findings below may reference images not displayed]

FINDINGS: Lower chest: 7 mm left lower lobe pulmonary nodule abutting the
major fissure (series 4, image 23). No basilar lung consolidation or
pleural effusion.

Hepatobiliary: No focal liver abnormality is seen. No gallstones,
gallbladder wall thickening, or biliary dilatation.

Pancreas: Unremarkable.

Spleen: Unremarkable.

Adrenals/Urinary Tract: Unremarkable adrenal glands. No evidence of
renal calculi, hydronephrosis, or mass on this unenhanced study. No
ureteral dilatation. Unremarkable bladder.

Stomach/Bowel: There may be a small sliding hiatal hernia. There is
no evidence of bowel obstruction or inflammation. The appendix is
unremarkable.

Vascular/Lymphatic: Normal caliber of the abdominal aorta. No
enlarged lymph nodes.

Reproductive: Unremarkable prostate.

Other: No intraperitoneal free fluid.

Musculoskeletal: No acute osseous abnormality or suspicious osseous
lesion.
IMPRESSION: 1. No evidence of urinary tract calculi, obstruction, or other acute
abnormality in the abdomen or pelvis.
2. 7 mm left lower lobe lung nodule, most likely benign and post
infectious/inflammatory in a patient of this age without a history
of malignancy. A follow-up chest CT could be considered in 6-12
months if clinically desired.

## 2022-04-18 ENCOUNTER — Other Ambulatory Visit: Payer: Self-pay

## 2022-04-18 ENCOUNTER — Emergency Department (HOSPITAL_BASED_OUTPATIENT_CLINIC_OR_DEPARTMENT_OTHER)
Admission: EM | Admit: 2022-04-18 | Discharge: 2022-04-18 | Disposition: A | Discharge status: Home / Self Care | Payer: Managed Care, Other (non HMO) | Attending: Emergency Medicine | Admitting: Emergency Medicine

## 2022-04-18 ENCOUNTER — Encounter (HOSPITAL_BASED_OUTPATIENT_CLINIC_OR_DEPARTMENT_OTHER): Payer: Self-pay | Admitting: Emergency Medicine

## 2022-04-18 DIAGNOSIS — I10 Essential (primary) hypertension: Secondary | ICD-10-CM | POA: Insufficient documentation

## 2022-04-18 DIAGNOSIS — Z79899 Other long term (current) drug therapy: Secondary | ICD-10-CM | POA: Diagnosis not present

## 2022-04-18 DIAGNOSIS — R0989 Other specified symptoms and signs involving the circulatory and respiratory systems: Secondary | ICD-10-CM | POA: Diagnosis present

## 2022-04-18 DIAGNOSIS — R09A2 Foreign body sensation, throat: Secondary | ICD-10-CM

## 2022-04-18 HISTORY — DX: Human immunodeficiency virus (HIV) disease: B20

## 2022-04-18 HISTORY — DX: Asymptomatic human immunodeficiency virus (hiv) infection status: Z21

## 2022-04-18 HISTORY — DX: Essential (primary) hypertension: I10

## 2022-04-18 LAB — GROUP A STREP BY PCR: Group A Strep by PCR: NOT DETECTED

## 2022-04-18 MED ORDER — HYDROCHLOROTHIAZIDE 25 MG PO TABS: 25.0000 mg | ORAL_TABLET | Freq: Every day | ORAL | 0 refills | Status: AC

## 2022-04-18 MED ORDER — AMLODIPINE BESYLATE 5 MG PO TABS: 10.0000 mg | ORAL_TABLET | Freq: Every day | ORAL | 0 refills | Status: AC

## 2022-04-18 MED ORDER — HYDROCHLOROTHIAZIDE 25 MG PO TABS
25.0000 mg | ORAL_TABLET | Freq: Once | ORAL | Status: AC
  Administered 2022-04-18: 25 mg via ORAL
  Filled 2022-04-18: qty 1

## 2022-04-18 MED ORDER — AMLODIPINE BESYLATE 5 MG PO TABS
10.0000 mg | ORAL_TABLET | Freq: Once | ORAL | Status: AC
  Administered 2022-04-18: 10 mg via ORAL
  Filled 2022-04-18: qty 2

## 2022-04-18 MED ORDER — ALUM & MAG HYDROXIDE-SIMETH 200-200-20 MG/5ML PO SUSP
30.0000 mL | Freq: Once | ORAL | Status: AC
  Administered 2022-04-18: 30 mL via ORAL
  Filled 2022-04-18: qty 30

## 2022-04-18 MED ORDER — FAMOTIDINE 20 MG PO TABS
20.0000 mg | ORAL_TABLET | Freq: Once | ORAL | Status: AC
  Administered 2022-04-18: 20 mg via ORAL
  Filled 2022-04-18: qty 1

## 2022-04-18 MED ORDER — FAMOTIDINE 20 MG PO TABS: 20.0000 mg | ORAL_TABLET | Freq: Every day | ORAL | 0 refills | Status: AC

## 2022-04-18 NOTE — ED Triage Notes (Addendum)
Pt reports sore throat for the past week. Feels pain with swallowing and that throat is swollen. Denies cough, or runny nose.  Also out of home BP medication (amlodipine 10mg  and hydrochlorothiazide 25mg )

## 2022-04-18 NOTE — Discharge Instructions (Addendum)
You were seen in the emergency department for your throat discomfort.  You tested negative for strep and had no obvious signs of any masses or swelling to your throat.  Sometimes this is due to acid reflux and I have given you an antacid that you should take daily for at least the next 2 weeks to see if it helps with your symptoms.  If you are not having any improvement, you can follow-up with the ear nose and throat doctor for further evaluation.  You should also try to cut back on smoking and any alcohol use and this can help with your symptoms.  Your blood pressure was also high here in the emergency department and I have given you a refill of your blood pressure medication.  You should take this daily as prescribed.  You can follow-up in the primary care clinic to have your blood pressure rechecked and for further refills.  You should return to the emergency department if you are unable to swallow liquids or solids, your vomit immediately after eating, or if you have any other new or concerning symptoms.

## 2022-04-18 NOTE — ED Provider Notes (Signed)
[facility] EMERGENCY DEPARTMENT Provider Note   CSN: [id number] Arrival date & time: 04/18/22  8338     History  Chief Complaint  Patient presents with   Sore Throat    Thomas Rivera is a 36 y.o. male.  Is a 36 year old male with a past medical history of hypertension presenting to the emergency department with globus sensation.  Patient states that he feels like there is a lump in his throat that has been going on for the past 2 weeks.  He states that this sensation comes and goes and tends to be worse at night.  He denies significant pain.  He denies any fevers or chills, cough or congestion.  He states that he tried a throat lozenge last night without significant relief.  He states he has been tolerating swallowing both liquids and solids.  Patient is also requesting a refill of his blood pressure medications and a primary doctor to follow-up with.  The history is provided by the patient.  Sore Throat       Home Medications Prior to Admission medications   Medication Sig Start Date End Date Taking? Authorizing Provider  amLODipine (NORVASC) 5 MG tablet Take 2 tablets (10 mg total) by mouth daily. 04/18/22 05/18/22 Yes Theresia Lo, Benetta Spar K, DO  famotidine (PEPCID) 20 MG tablet Take 1 tablet (20 mg total) by mouth daily. 04/18/22  Yes Theresia Lo, Amberly Livas K, DO  hydrochlorothiazide (HYDRODIURIL) 25 MG tablet Take 1 tablet (25 mg total) by mouth daily. 04/18/22  Yes Theresia Lo, [place] K, DO  amLODipine (NORVASC) 10 MG tablet Take 10 mg by mouth daily.  09/02/18   [provider]  Aspirin-Salicylamide-Caffeine (BC HEADACHE PO) Take 1 packet by mouth daily as needed (pain).    [provider]  lidocaine (XYLOCAINE) 5 % ointment Apply 1 application topically 3 (three) times daily as needed. Patient not taking: Reported on 10/05/2018 10/03/18   Long, Arlyss Repress, MD  methocarbamol (ROBAXIN) 500 MG tablet Take 1 tablet (500 mg total) by mouth every 8 (eight)  hours as needed for muscle spasms. 10/03/18   Long, Arlyss Repress, MD  ondansetron (ZOFRAN) 4 MG tablet Take 1 tablet (4 mg total) by mouth every 6 (six) hours as needed for nausea. Patient not taking: Reported on 10/05/2018 06/20/12   Cathlyn Parsons, NP  oxyCODONE-acetaminophen (PERCOCET/ROXICET) 5-325 MG tablet Take 1-2 tablets by mouth every 8 (eight) hours as needed for severe pain. 10/05/18   Benjiman Core, MD  predniSONE (DELTASONE) 20 MG tablet Take 2 tablets (40 mg total) by mouth daily. 10/06/18   Benjiman Core, MD  TRIUMEQ 600-50-300 MG tablet Take 1 tablet by mouth daily.  08/31/18   [provider]  Vitamin D, Ergocalciferol, (DRISDOL) 1.25 MG (50000 UT) CAPS capsule Take 50,000 Units by mouth every 7 (seven) days.  08/04/18   [provider]      Allergies    Amoxicillin, Bee venom, and Nutritional supplements    Review of Systems   Review of Systems  Physical Exam Updated Vital Signs BP (!) 189/110 (BP Location: Left Wrist)   Pulse 60   Temp 97.8 F (36.6 C) (Oral)   Resp 19   SpO2 100%  Physical Exam Vitals and nursing note reviewed.  Constitutional:      General: He is not in acute distress.    Appearance: He is well-developed.  HENT:     Head: Normocephalic and atraumatic.     Nose: No congestion or rhinorrhea.  Mouth/Throat:     Mouth: Mucous membranes are moist. No oral lesions.     Pharynx: Oropharynx is clear. Uvula midline. No pharyngeal swelling, oropharyngeal exudate, posterior oropharyngeal erythema or uvula swelling.     Tonsils: No tonsillar exudate or tonsillar abscesses. 0 on the right. 0 on the left.  Eyes:     Conjunctiva/sclera: Conjunctivae normal.     Pupils: Pupils are equal, round, and reactive to light.  Neck:     Thyroid: No thyromegaly.  Cardiovascular:     Rate and Rhythm: Normal rate and regular rhythm.     Heart sounds: Normal heart sounds.  Pulmonary:     Effort: Pulmonary effort is normal.     Breath sounds:  Normal breath sounds. No stridor.  Abdominal:     Palpations: Abdomen is soft.  Musculoskeletal:     Cervical back: Normal range of motion and neck supple.  Lymphadenopathy:     Cervical: No cervical adenopathy.  Skin:    General: Skin is warm and dry.  Neurological:     General: No focal deficit present.     Mental Status: He is alert and oriented to person, place, and time.  Psychiatric:        Mood and Affect: Mood normal.        Behavior: Behavior normal.     ED Results / Procedures / Treatments   Labs (all labs ordered are listed, but only abnormal results are displayed) Labs Reviewed  GROUP A STREP BY PCR    EKG None  Radiology No results found.  Procedures Procedures    Medications Ordered in ED Medications  amLODipine (NORVASC) tablet 10 mg (10 mg Oral Given 04/18/22 1222)  hydrochlorothiazide (HYDRODIURIL) tablet 25 mg (25 mg Oral Given 04/18/22 1222)  famotidine (PEPCID) tablet 20 mg (20 mg Oral Given 04/18/22 1222)  alum & mag hydroxide-simeth (MAALOX/MYLANTA) 200-200-20 MG/5ML suspension 30 mL (30 mLs Oral Given 04/18/22 1222)    ED Course/ Medical Decision Making/ A&P                           Medical Decision Making This patient presents to the ED with chief complaint(s) of globus sensation with pertinent past medical history of HTN which further complicates the presenting complaint. The complaint involves an extensive differential diagnosis and also carries with it a high risk of complications and morbidity.    The differential diagnosis includes patient is no tonsillar swelling or exudates making infection unlikely, no evidence of uvular swelling making uvulitis unlikely, no palpable lymphadenopathy or external masses.  He has been able to swallow both solids and liquids making foreign body unlikely.  Considering possible acid reflux, viral syndrome  Additional history obtained: Additional history obtained from N/A Records reviewed N/A  ED Course  and Reassessment: Patient was initially evaluated by triage and had strep swab performed that was negative.  He is no signs of PTA or RPA or other swelling to the posterior oropharynx and no palpable lymphadenopathy or masses within the neck to explain his symptoms.  He has no stridor on exam and is in no acute distress and has been tolerating p.o.  He was recommended to trial antacids and will be given ENT follow-up.  Patient will also be given refill of his blood pressure medication and given primary care follow-up.  He was given strict return precautions.  Independent labs interpretation:  The following labs were independently interpreted: Strep negative  Independent visualization  of imaging: N/A  Consultation: - Consulted or discussed management/test interpretation w/ external professional: N/A  Consideration for admission or further workup: Patient has no emergent conditions requiring admission or further work-up at this time and is stable for discharge home with primary care follow-up  Social Determinants of health: N/A            Final Clinical Impression(s) / ED Diagnoses Final diagnoses:  Globus sensation  Hypertension, unspecified type    Rx / DC Orders ED Discharge Orders          Ordered    famotidine (PEPCID) 20 MG tablet  Daily        04/18/22 1221    amLODipine (NORVASC) 5 MG tablet  Daily        04/18/22 1221    hydrochlorothiazide (HYDRODIURIL) 25 MG tablet  Daily        04/18/22 1221              Elayne Snare K, DO 04/18/22 1223

## 2022-10-12 DIAGNOSIS — B20 Human immunodeficiency virus [HIV] disease: Secondary | ICD-10-CM | POA: Diagnosis not present

## 2022-10-26 DIAGNOSIS — G4700 Insomnia, unspecified: Secondary | ICD-10-CM | POA: Diagnosis not present

## 2022-10-26 DIAGNOSIS — Z79899 Other long term (current) drug therapy: Secondary | ICD-10-CM | POA: Diagnosis not present

## 2022-10-26 DIAGNOSIS — F419 Anxiety disorder, unspecified: Secondary | ICD-10-CM | POA: Diagnosis not present

## 2022-10-26 DIAGNOSIS — Z23 Encounter for immunization: Secondary | ICD-10-CM | POA: Diagnosis not present

## 2022-10-26 DIAGNOSIS — B20 Human immunodeficiency virus [HIV] disease: Secondary | ICD-10-CM | POA: Diagnosis not present

## 2022-11-04 DIAGNOSIS — K6282 Dysplasia of anus: Secondary | ICD-10-CM | POA: Diagnosis not present

## 2022-11-04 DIAGNOSIS — R85610 Atypical squamous cells of undetermined significance on cytologic smear of anus (ASC-US): Secondary | ICD-10-CM | POA: Diagnosis not present

## 2022-12-07 DIAGNOSIS — B20 Human immunodeficiency virus [HIV] disease: Secondary | ICD-10-CM | POA: Diagnosis not present

## 2023-01-06 DIAGNOSIS — I10 Essential (primary) hypertension: Secondary | ICD-10-CM | POA: Diagnosis not present

## 2023-01-06 DIAGNOSIS — K219 Gastro-esophageal reflux disease without esophagitis: Secondary | ICD-10-CM | POA: Diagnosis not present

## 2023-01-06 DIAGNOSIS — Z6839 Body mass index (BMI) 39.0-39.9, adult: Secondary | ICD-10-CM | POA: Diagnosis not present

## 2023-01-21 ENCOUNTER — Encounter: Payer: Self-pay | Admitting: Emergency Medicine

## 2023-01-21 ENCOUNTER — Ambulatory Visit
Admission: EM | Admit: 2023-01-21 | Discharge: 2023-01-21 | Disposition: A | Discharge status: Home / Self Care | Payer: 59 | Attending: Family Medicine | Admitting: Family Medicine

## 2023-01-21 DIAGNOSIS — R55 Syncope and collapse: Secondary | ICD-10-CM | POA: Diagnosis not present

## 2023-01-21 NOTE — ED Provider Notes (Signed)
[facility]    CSN: [id number] Arrival date & time: 01/21/23  1723      History   Chief Complaint Chief Complaint  Patient presents with   syncope    HPI Thomas Rivera is a 37 y.o. male.   Patient is a very pleasant 37 year old gentleman who is here for evaluation of syncope.  He states that yesterday around 7 PM while sitting on the couch he felt a little "weak and woozy".  He got up and went into the kitchen to get a grape and as he was holding onto the counter he yelled out for his husband because he felt like he was about to faint.  He states that his vision narrowed down, he had difficulty focusing, as his husband arrived in the kitchen he slumped to the floor.  He was very briefly by it and then started talking again.  Shortly he asked to sit up.  He wanted to get back on the couch so crawled to the living room to sit on the couch but still felt weak.  He asked for help getting to the bathroom to take a cold shower, thinking this might help.  He slumped to the ground on the way to the bathroom and this time was quiet for a minute or 2 before he came to.  He mated to the bathroom and had his feet in the shower with cold water when he fainted the third time this time for another couple of minutes.  His husband has some medical experience so he was sternal rubs and speaking to him to try to keep him alert.  He reports that at no time was there any muscle twitching or seizure activity.  No incontinence.  Patient states that he had the vision changes and awareness he was going to faint but no sweats, no palpitations, no shortness of breath, no headache Patient has never had spells like this before.  No recent head trauma.  No recent infection.  No fever or chills Patient has hypertension.  He is compliant with his medication.  His blood pressure is adequately controlled.  No history of heart disease or palpitations. Patient is under treatment for HIV with an undetectable  level He goes to infectious disease on a regular basis for blood work and medication management He has a remote history of SLE but has not needed treatment or had positive blood work for some time    Past Medical History:  Diagnosis Date   GERD (gastroesophageal reflux disease)    History of chicken pox    childhood   HIV (human immunodeficiency virus infection) (HCC)    Hypertension    Migraines     1-2 per month   Obesity    Seasonal allergies     Patient Active Problem List   Diagnosis Date Noted   SLE (systemic lupus erythematosus) (HCC) 05/12/2012   HTN (hypertension) 05/12/2012   Annual physical exam 01/02/2011   Low HDL (under 40) 01/02/2011   Gynecomastia 01/02/2011    Past Surgical History:  Procedure Laterality Date   WISDOM TOOTH EXTRACTION  2009       Home Medications    Prior to Admission medications   Medication Sig Start Date End Date Taking? Authorizing Provider  amLODipine (NORVASC) 10 MG tablet Take 10 mg by mouth daily.  09/02/18  Yes [provider]  cabotegravir & rilpivirine ER (CABENUVA) 600 & 900 MG/3ML injection Inject 1 kit into the muscle every 30 (thirty) days.  05/12/22  Yes [provider]  famotidine (PEPCID) 20 MG tablet Take 1 tablet (20 mg total) by mouth daily. 04/18/22  Yes Theresia Lo, Victoria K, DO  hydrochlorothiazide (HYDRODIURIL) 25 MG tablet Take 1 tablet (25 mg total) by mouth daily. 04/18/22  Yes Elayne Snare K, DO  hydrOXYzine (ATARAX) 25 MG tablet Take 25 mg by mouth every 6 (six) hours as needed. 11/19/22  Yes [provider]  amLODipine (NORVASC) 5 MG tablet Take 2 tablets (10 mg total) by mouth daily. 04/18/22 05/18/22  Rexford Maus, DO  Aspirin-Salicylamide-Caffeine (BC HEADACHE PO) Take 1 packet by mouth daily as needed (pain).    [provider]  lidocaine (XYLOCAINE) 5 % ointment Apply 1 application topically 3 (three) times daily as needed. Patient not taking: Reported on  10/05/2018 10/03/18   Long, Arlyss Repress, MD  ondansetron (ZOFRAN) 4 MG tablet Take 1 tablet (4 mg total) by mouth every 6 (six) hours as needed for nausea. Patient not taking: Reported on 10/05/2018 06/20/12   Cathlyn Parsons, NP    Family History Family History  Problem Relation Age of Onset   Arthritis Other        maternal great grandmother   Hypertension Maternal Grandmother    Diabetes Other        maternal great grandmother   Healthy Brother        half     Social History Social History   Tobacco Use   Smoking status: Never   Smokeless tobacco: Never  Vaping Use   Vaping status: Never Used  Substance Use Topics   Alcohol use: Yes    Comment: socially   Drug use: No     Allergies   Amoxicillin, Bee venom, and Nutritional supplements   Review of Systems Review of Systems See HPI  Physical Exam Triage Vital Signs ED Triage Vitals  Encounter Vitals Group     BP 01/21/23 1744 (!) 173/84     Systolic BP Percentile --      Diastolic BP Percentile --      Pulse Rate 01/21/23 1744 64     Resp 01/21/23 1744 16     Temp 01/21/23 1744 99 F (37.2 C)     Temp Source 01/21/23 1744 Oral     SpO2 01/21/23 1744 100 %     Weight 01/21/23 1746 260 lb (117.9 kg)     Height 01/21/23 1746 5' 9.5" (1.765 m)     Head Circumference --      Peak Flow --      Pain Score 01/21/23 1746 3     Pain Loc --      Pain Education --      Exclude from Growth Chart --    Orthostatic VS for the past 24 hrs:  BP- Lying Pulse- Lying BP- Sitting Pulse- Sitting BP- Standing at 0 minutes Pulse- Standing at 0 minutes  01/21/23 1750 (!) 162/91 62 (!) 156/95 76 (!) 160/97 71    Updated Vital Signs BP (!) 173/84 (BP Location: Left Arm)   Pulse 64   Temp 99 F (37.2 C) (Oral)   Resp 16   Ht 5' 9.5" (1.765 m)   Wt 117.9 kg   SpO2 100%   BMI 37.84 kg/m   \    Physical Exam Constitutional:      General: He is not in acute distress.    Appearance: He is well-developed. He is obese. He is  ill-appearing.  HENT:  Head: Normocephalic and atraumatic.     Right Ear: Tympanic membrane and ear canal normal.     Left Ear: Tympanic membrane and ear canal normal.     Nose: Nose normal. No rhinorrhea.     Mouth/Throat:     Mouth: Mucous membranes are moist.     Pharynx: No posterior oropharyngeal erythema.  Eyes:     Conjunctiva/sclera: Conjunctivae normal.     Pupils: Pupils are equal, round, and reactive to light.     Comments: Fundi neg  Cardiovascular:     Rate and Rhythm: Normal rate and regular rhythm.     Heart sounds: Normal heart sounds.  Pulmonary:     Effort: Pulmonary effort is normal. No respiratory distress.     Breath sounds: Normal breath sounds.  Abdominal:     General: Abdomen is flat. There is no distension.     Palpations: Abdomen is soft.     Comments: No mass or organomegaly  Musculoskeletal:        General: Normal range of motion.     Cervical back: Normal range of motion. No tenderness.     Right lower leg: No edema.     Left lower leg: No edema.  Lymphadenopathy:     Cervical: No cervical adenopathy.  Skin:    General: Skin is warm and dry.  Neurological:     General: No focal deficit present.     Mental Status: He is alert.     Cranial Nerves: No cranial nerve deficit.     Sensory: No sensory deficit.     Motor: No weakness.     Coordination: Coordination normal.     Gait: Gait normal.     Deep Tendon Reflexes: Reflexes normal.  Psychiatric:        Mood and Affect: Mood normal.      UC Treatments / Results  Labs (all labs ordered are listed, but only abnormal results are displayed) Labs Reviewed  CBC WITH DIFFERENTIAL/PLATELET  COMPREHENSIVE METABOLIC PANEL  RPR  TSH  SEDIMENTATION RATE  CYTOLOGY, (ORAL, ANAL, URETHRAL) ANCILLARY ONLY    EKG NSR.  Normal/  No ST or T changes No LVH  Radiology No results found.  Procedures Procedures (including critical care time)  Medications Ordered in UC Medications - No data to  display  Initial Impression / Assessment and Plan / UC Course  I have reviewed the triage vital signs and the nursing notes.  Pertinent labs & imaging results that were available during my care of the patient were reviewed by me and considered in my medical decision making (see chart for details).     Patient requests lab work including STD testing.  He says he will follow-up with his PCP for additional testing. We discussed the multitude of diagnoses that can cause syncope.  The multitude of organ systems that can be involved.  He needs additional workup.  At the very least a Holter monitor and some cardiac workup.  He may need a neurovisit and EEG.  I tried to impress upon patient and his husband that they need to go to the ER if the syncope returns Final Clinical Impressions(s) / UC Diagnoses   Final diagnoses:  Syncope, unspecified syncope type     Discharge Instructions      Your lab tests will be available on my Chart You NEED to call your PCP tomorrow to set up a follow up appointment You NEED additional testing such as heart monitor  If the  fainting happens again go to ER or call 911     ED Prescriptions   None    PDMP not reviewed this encounter.   Eustace Moore, MD 01/21/23 [phone]

## 2023-01-21 NOTE — ED Triage Notes (Signed)
Patient states that he "fainted x 3 yesterday."  He wasn't doing any type of activity prior fainting.  Patient did not totally loose consciousness during these episodes.  He heard these high pitch frequency sounds in his ears prior to fainting, a sensation that went from his right side of his head to the left side of his head.   Patient had eaten, drinking water, no sodas.  No CP, slight SOB, possible anxiety.

## 2023-01-21 NOTE — Discharge Instructions (Signed)
Your lab tests will be available on my Chart You NEED to call your PCP tomorrow to set up a follow up appointment You NEED additional testing such as heart monitor  If the fainting happens again go to ER or call 911

## 2023-01-25 LAB — CYTOLOGY, (ORAL, ANAL, URETHRAL) ANCILLARY ONLY
Chlamydia: NEGATIVE
Comment: NEGATIVE
Comment: NEGATIVE
Comment: NORMAL
Neisseria Gonorrhea: NEGATIVE
Trichomonas: NEGATIVE

## 2023-01-26 LAB — TSH: TSH: 1.63 u[IU]/mL (ref 0.450–4.500)

## 2023-01-26 LAB — CBC WITH DIFFERENTIAL/PLATELET
Basophils Absolute: 0 10*3/uL (ref 0.0–0.2)
Basos: 0 %
EOS (ABSOLUTE): 0.1 10*3/uL (ref 0.0–0.4)
Eos: 2 %
Hematocrit: 43.8 % (ref 37.5–51.0)
Hemoglobin: 13.9 g/dL (ref 13.0–17.7)
Immature Grans (Abs): 0 10*3/uL (ref 0.0–0.1)
Immature Granulocytes: 0 %
Lymphocytes Absolute: 2.5 10*3/uL (ref 0.7–3.1)
Lymphs: 49 %
MCH: 25.4 pg — ABNORMAL LOW (ref 26.6–33.0)
MCHC: 31.7 g/dL (ref 31.5–35.7)
MCV: 80 fL (ref 79–97)
Monocytes Absolute: 0.5 10*3/uL (ref 0.1–0.9)
Monocytes: 10 %
Neutrophils Absolute: 2 10*3/uL (ref 1.4–7.0)
Neutrophils: 39 %
Platelets: 422 10*3/uL (ref 150–450)
RBC: 5.48 x10E6/uL (ref 4.14–5.80)
RDW: 13.3 % (ref 11.6–15.4)
WBC: 5.1 10*3/uL (ref 3.4–10.8)

## 2023-01-26 LAB — COMPREHENSIVE METABOLIC PANEL
ALT: 19 [IU]/L (ref 0–44)
AST: 18 [IU]/L (ref 0–40)
Albumin: 4.3 g/dL (ref 4.1–5.1)
Alkaline Phosphatase: 63 [IU]/L (ref 44–121)
BUN/Creatinine Ratio: 14 (ref 9–20)
BUN: 13 mg/dL (ref 6–20)
Bilirubin Total: 0.6 mg/dL (ref 0.0–1.2)
CO2: 27 mmol/L (ref 20–29)
Calcium: 9.8 mg/dL (ref 8.7–10.2)
Chloride: 102 mmol/L (ref 96–106)
Creatinine, Ser: 0.93 mg/dL (ref 0.76–1.27)
Globulin, Total: 2.3 g/dL (ref 1.5–4.5)
Glucose: 92 mg/dL (ref 70–99)
Potassium: 4.3 mmol/L (ref 3.5–5.2)
Sodium: 143 mmol/L (ref 134–144)
Total Protein: 6.6 g/dL (ref 6.0–8.5)
eGFR: 109 mL/min/{1.73_m2} (ref >59)

## 2023-01-26 LAB — SEDIMENTATION RATE

## 2023-01-26 LAB — RPR, QUANT+TP ABS (REFLEX)
Rapid Plasma Reagin, Quant: 1:1 {titer} — ABNORMAL HIGH
T Pallidum Abs: REACTIVE — AB

## 2023-01-26 LAB — RPR: RPR Ser Ql: REACTIVE — AB

## 2023-02-01 DIAGNOSIS — Z21 Asymptomatic human immunodeficiency virus [HIV] infection status: Secondary | ICD-10-CM | POA: Diagnosis not present

## 2023-03-11 DIAGNOSIS — M25519 Pain in unspecified shoulder: Secondary | ICD-10-CM | POA: Diagnosis not present

## 2023-03-11 DIAGNOSIS — M542 Cervicalgia: Secondary | ICD-10-CM | POA: Diagnosis not present

## 2023-03-11 DIAGNOSIS — M25569 Pain in unspecified knee: Secondary | ICD-10-CM | POA: Diagnosis not present

## 2023-03-11 DIAGNOSIS — M79669 Pain in unspecified lower leg: Secondary | ICD-10-CM | POA: Diagnosis not present

## 2023-03-22 DIAGNOSIS — Z23 Encounter for immunization: Secondary | ICD-10-CM | POA: Diagnosis not present

## 2023-03-22 DIAGNOSIS — G4700 Insomnia, unspecified: Secondary | ICD-10-CM | POA: Diagnosis not present

## 2023-03-22 DIAGNOSIS — R55 Syncope and collapse: Secondary | ICD-10-CM | POA: Diagnosis not present

## 2023-03-22 DIAGNOSIS — Z79899 Other long term (current) drug therapy: Secondary | ICD-10-CM | POA: Diagnosis not present

## 2023-03-22 DIAGNOSIS — B20 Human immunodeficiency virus [HIV] disease: Secondary | ICD-10-CM | POA: Diagnosis not present

## 2023-03-22 DIAGNOSIS — I10 Essential (primary) hypertension: Secondary | ICD-10-CM | POA: Diagnosis not present

## 2023-03-22 DIAGNOSIS — Z0000 Encounter for general adult medical examination without abnormal findings: Secondary | ICD-10-CM | POA: Diagnosis not present

## 2023-03-24 DIAGNOSIS — A545 Gonococcal pharyngitis: Secondary | ICD-10-CM | POA: Diagnosis not present

## 2023-03-26 DIAGNOSIS — A545 Gonococcal pharyngitis: Secondary | ICD-10-CM | POA: Diagnosis not present

## 2023-03-30 DIAGNOSIS — Z21 Asymptomatic human immunodeficiency virus [HIV] infection status: Secondary | ICD-10-CM | POA: Diagnosis not present

## 2023-04-01 DIAGNOSIS — M25519 Pain in unspecified shoulder: Secondary | ICD-10-CM | POA: Diagnosis not present

## 2023-04-01 DIAGNOSIS — M79669 Pain in unspecified lower leg: Secondary | ICD-10-CM | POA: Diagnosis not present

## 2023-04-01 DIAGNOSIS — M542 Cervicalgia: Secondary | ICD-10-CM | POA: Diagnosis not present

## 2023-04-01 DIAGNOSIS — M25569 Pain in unspecified knee: Secondary | ICD-10-CM | POA: Diagnosis not present

## 2023-04-08 DIAGNOSIS — M79669 Pain in unspecified lower leg: Secondary | ICD-10-CM | POA: Diagnosis not present

## 2023-04-08 DIAGNOSIS — M25519 Pain in unspecified shoulder: Secondary | ICD-10-CM | POA: Diagnosis not present

## 2023-04-08 DIAGNOSIS — M542 Cervicalgia: Secondary | ICD-10-CM | POA: Diagnosis not present

## 2023-04-08 DIAGNOSIS — M25569 Pain in unspecified knee: Secondary | ICD-10-CM | POA: Diagnosis not present

## 2023-04-15 DIAGNOSIS — M25519 Pain in unspecified shoulder: Secondary | ICD-10-CM | POA: Diagnosis not present

## 2023-04-15 DIAGNOSIS — M542 Cervicalgia: Secondary | ICD-10-CM | POA: Diagnosis not present

## 2023-04-15 DIAGNOSIS — M25569 Pain in unspecified knee: Secondary | ICD-10-CM | POA: Diagnosis not present

## 2023-04-15 DIAGNOSIS — M79669 Pain in unspecified lower leg: Secondary | ICD-10-CM | POA: Diagnosis not present

## 2023-05-13 DIAGNOSIS — A549 Gonococcal infection, unspecified: Secondary | ICD-10-CM | POA: Diagnosis not present

## 2023-05-31 DIAGNOSIS — B20 Human immunodeficiency virus [HIV] disease: Secondary | ICD-10-CM | POA: Diagnosis not present

## 2023-07-09 DIAGNOSIS — J309 Allergic rhinitis, unspecified: Secondary | ICD-10-CM | POA: Diagnosis not present

## 2023-07-09 DIAGNOSIS — Z6841 Body Mass Index (BMI) 40.0 and over, adult: Secondary | ICD-10-CM | POA: Diagnosis not present

## 2023-07-09 DIAGNOSIS — Z13220 Encounter for screening for lipoid disorders: Secondary | ICD-10-CM | POA: Diagnosis not present

## 2023-07-09 DIAGNOSIS — I10 Essential (primary) hypertension: Secondary | ICD-10-CM | POA: Diagnosis not present

## 2023-07-09 DIAGNOSIS — Z1159 Encounter for screening for other viral diseases: Secondary | ICD-10-CM | POA: Diagnosis not present

## 2023-07-09 DIAGNOSIS — E66813 Obesity, class 3: Secondary | ICD-10-CM | POA: Diagnosis not present

## 2023-07-09 DIAGNOSIS — Z0000 Encounter for general adult medical examination without abnormal findings: Secondary | ICD-10-CM | POA: Diagnosis not present

## 2023-07-26 DIAGNOSIS — Z21 Asymptomatic human immunodeficiency virus [HIV] infection status: Secondary | ICD-10-CM | POA: Diagnosis not present

## 2023-07-26 DIAGNOSIS — F331 Major depressive disorder, recurrent, moderate: Secondary | ICD-10-CM | POA: Diagnosis not present

## 2023-08-02 DIAGNOSIS — F331 Major depressive disorder, recurrent, moderate: Secondary | ICD-10-CM | POA: Diagnosis not present

## 2023-09-20 DIAGNOSIS — B20 Human immunodeficiency virus [HIV] disease: Secondary | ICD-10-CM | POA: Diagnosis not present

## 2023-09-20 DIAGNOSIS — K6282 Dysplasia of anus: Secondary | ICD-10-CM | POA: Diagnosis not present

## 2023-09-20 DIAGNOSIS — Z21 Asymptomatic human immunodeficiency virus [HIV] infection status: Secondary | ICD-10-CM | POA: Diagnosis not present

## 2023-11-15 DIAGNOSIS — Z21 Asymptomatic human immunodeficiency virus [HIV] infection status: Secondary | ICD-10-CM | POA: Diagnosis not present

## 2023-11-24 DIAGNOSIS — K6282 Dysplasia of anus: Secondary | ICD-10-CM | POA: Diagnosis not present

## 2023-11-24 DIAGNOSIS — R85610 Atypical squamous cells of undetermined significance on cytologic smear of anus (ASC-US): Secondary | ICD-10-CM | POA: Diagnosis not present

## 2024-01-17 DIAGNOSIS — Z21 Asymptomatic human immunodeficiency virus [HIV] infection status: Secondary | ICD-10-CM | POA: Diagnosis not present

## 2024-03-13 ENCOUNTER — Emergency Department (HOSPITAL_COMMUNITY)
Admission: EM | Admit: 2024-03-13 | Discharge: 2024-03-13 | Discharge status: Against Medical Advice | Attending: Emergency Medicine | Admitting: Emergency Medicine

## 2024-03-13 ENCOUNTER — Encounter (HOSPITAL_COMMUNITY): Payer: Self-pay

## 2024-03-13 ENCOUNTER — Other Ambulatory Visit: Payer: Self-pay

## 2024-03-13 DIAGNOSIS — R42 Dizziness and giddiness: Secondary | ICD-10-CM | POA: Diagnosis not present

## 2024-03-13 DIAGNOSIS — Z5321 Procedure and treatment not carried out due to patient leaving prior to being seen by health care provider: Secondary | ICD-10-CM | POA: Diagnosis not present

## 2024-03-13 LAB — COMPREHENSIVE METABOLIC PANEL WITH GFR
ALT: 29 U/L (ref 0–44)
AST: 35 U/L (ref 15–41)
Albumin: 4.5 g/dL (ref 3.5–5.0)
Alkaline Phosphatase: 83 U/L (ref 38–126)
Anion gap: 10 (ref 5–15)
BUN: 8 mg/dL (ref 6–20)
CO2: 29 mmol/L (ref 22–32)
Calcium: 9.6 mg/dL (ref 8.9–10.3)
Chloride: 100 mmol/L (ref 98–111)
Creatinine, Ser: 0.82 mg/dL (ref 0.61–1.24)
GFR, Estimated: >60 mL/min (ref >60)
Glucose, Bld: 95 mg/dL (ref 70–99)
Potassium: 3.7 mmol/L (ref 3.5–5.1)
Sodium: 139 mmol/L (ref 135–145)
Total Bilirubin: 0.8 mg/dL (ref 0.0–1.2)
Total Protein: 7.7 g/dL (ref 6.5–8.1)

## 2024-03-13 LAB — CBC
HCT: 44.1 % (ref 39.0–52.0)
Hemoglobin: 14.5 g/dL (ref 13.0–17.0)
MCH: 25 pg — ABNORMAL LOW (ref 26.0–34.0)
MCHC: 32.9 g/dL (ref 30.0–36.0)
MCV: 76.2 fL — ABNORMAL LOW (ref 80.0–100.0)
Platelets: 355 K/uL (ref 150–400)
RBC: 5.79 MIL/uL (ref 4.22–5.81)
RDW: 13.7 % (ref 11.5–15.5)
WBC: 5.1 K/uL (ref 4.0–10.5)
nRBC: 0 % (ref 0.0–0.2)

## 2024-03-13 LAB — URINALYSIS, ROUTINE W REFLEX MICROSCOPIC
Bilirubin Urine: NEGATIVE
Glucose, UA: NEGATIVE mg/dL
Hgb urine dipstick: NEGATIVE
Ketones, ur: NEGATIVE mg/dL
Leukocytes,Ua: NEGATIVE
Nitrite: NEGATIVE
Protein, ur: NEGATIVE mg/dL
Specific Gravity, Urine: 1.005 (ref 1.005–1.030)
pH: 8 (ref 5.0–8.0)

## 2024-03-13 LAB — CBG MONITORING, ED: Glucose-Capillary: 97 mg/dL (ref 70–99)

## 2024-03-13 NOTE — ED Triage Notes (Signed)
 PT arrives via POV. PT reports he had a routine appointment today and his blood pressure was elevated. PT states he did forget to take his blood pressure this morning, but took it around an hour or two ago. PT reports he began experiencing dizziness after his appointment. No headache, no focal neurological deficits noted. Pt is AxOx4.

## 2024-03-15 DIAGNOSIS — Z21 Asymptomatic human immunodeficiency virus [HIV] infection status: Secondary | ICD-10-CM | POA: Diagnosis not present

## 2024-03-20 DIAGNOSIS — F410 Panic disorder [episodic paroxysmal anxiety] without agoraphobia: Secondary | ICD-10-CM | POA: Diagnosis not present

## 2024-03-20 DIAGNOSIS — Z1159 Encounter for screening for other viral diseases: Secondary | ICD-10-CM | POA: Diagnosis not present

## 2024-03-20 DIAGNOSIS — J309 Allergic rhinitis, unspecified: Secondary | ICD-10-CM | POA: Diagnosis not present

## 2024-03-20 DIAGNOSIS — E66813 Obesity, class 3: Secondary | ICD-10-CM | POA: Diagnosis not present

## 2024-03-20 DIAGNOSIS — Z6841 Body Mass Index (BMI) 40.0 and over, adult: Secondary | ICD-10-CM | POA: Diagnosis not present

## 2024-03-20 DIAGNOSIS — R062 Wheezing: Secondary | ICD-10-CM | POA: Diagnosis not present

## 2024-03-20 DIAGNOSIS — F419 Anxiety disorder, unspecified: Secondary | ICD-10-CM | POA: Diagnosis not present

## 2024-03-20 DIAGNOSIS — Z13220 Encounter for screening for lipoid disorders: Secondary | ICD-10-CM | POA: Diagnosis not present

## 2024-03-20 DIAGNOSIS — Z0000 Encounter for general adult medical examination without abnormal findings: Secondary | ICD-10-CM | POA: Diagnosis not present

## 2024-03-20 DIAGNOSIS — I10 Essential (primary) hypertension: Secondary | ICD-10-CM | POA: Diagnosis not present

## 2024-03-20 DIAGNOSIS — Z9189 Other specified personal risk factors, not elsewhere classified: Secondary | ICD-10-CM | POA: Diagnosis not present

## 2024-03-20 DIAGNOSIS — K219 Gastro-esophageal reflux disease without esophagitis: Secondary | ICD-10-CM | POA: Diagnosis not present

## 2024-03-27 DIAGNOSIS — Z79899 Other long term (current) drug therapy: Secondary | ICD-10-CM | POA: Diagnosis not present

## 2024-03-27 DIAGNOSIS — B20 Human immunodeficiency virus [HIV] disease: Secondary | ICD-10-CM | POA: Diagnosis not present

## 2024-03-27 DIAGNOSIS — Z23 Encounter for immunization: Secondary | ICD-10-CM | POA: Diagnosis not present

## 2024-03-27 DIAGNOSIS — Z21 Asymptomatic human immunodeficiency virus [HIV] infection status: Secondary | ICD-10-CM | POA: Diagnosis not present

## 2024-03-27 DIAGNOSIS — F419 Anxiety disorder, unspecified: Secondary | ICD-10-CM | POA: Diagnosis not present

## 2024-03-27 DIAGNOSIS — F410 Panic disorder [episodic paroxysmal anxiety] without agoraphobia: Secondary | ICD-10-CM | POA: Diagnosis not present
